# Patient Record
Sex: Female | Born: 1985 | Race: Black or African American | Hispanic: No | Marital: Single | Smoking: Never smoker
Health system: Southern US, Community
[De-identification: ages and names within clinical notes are randomized; demographics above are authoritative.]

## PROBLEM LIST (undated history)

## (undated) DIAGNOSIS — N200 Calculus of kidney: Secondary | ICD-10-CM

## (undated) DIAGNOSIS — Z87442 Personal history of urinary calculi: Secondary | ICD-10-CM

---

## 2011-12-03 ENCOUNTER — Emergency Department (HOSPITAL_COMMUNITY)
Admission: EM | Admit: 2011-12-03 | Discharge: 2011-12-04 | Disposition: A | Discharge status: Home / Self Care | Payer: Self-pay | Attending: Emergency Medicine | Admitting: Emergency Medicine

## 2011-12-03 DIAGNOSIS — N390 Urinary tract infection, site not specified: Secondary | ICD-10-CM | POA: Insufficient documentation

## 2011-12-03 DIAGNOSIS — R1032 Left lower quadrant pain: Secondary | ICD-10-CM | POA: Insufficient documentation

## 2011-12-03 DIAGNOSIS — Z87440 Personal history of urinary (tract) infections: Secondary | ICD-10-CM | POA: Insufficient documentation

## 2011-12-03 DIAGNOSIS — R300 Dysuria: Secondary | ICD-10-CM | POA: Insufficient documentation

## 2011-12-03 DIAGNOSIS — R112 Nausea with vomiting, unspecified: Secondary | ICD-10-CM | POA: Insufficient documentation

## 2011-12-04 ENCOUNTER — Encounter: Payer: Self-pay | Admitting: Emergency Medicine

## 2011-12-04 LAB — URINALYSIS, ROUTINE W REFLEX MICROSCOPIC
Bilirubin Urine: NEGATIVE
Glucose, UA: NEGATIVE mg/dL
Ketones, ur: NEGATIVE mg/dL
Specific Gravity, Urine: 1.026 (ref 1.005–1.030)
pH: 6 (ref 5.0–8.0)

## 2011-12-04 LAB — URINE MICROSCOPIC-ADD ON

## 2011-12-04 MED ORDER — NAPROXEN 500 MG PO TABS: 500.0000 mg | ORAL_TABLET | Freq: Two times a day (BID) | ORAL | Status: AC

## 2011-12-04 MED ORDER — PHENAZOPYRIDINE HCL 200 MG PO TABS: 200.0000 mg | ORAL_TABLET | Freq: Three times a day (TID) | ORAL | Status: AC

## 2011-12-04 MED ORDER — IBUPROFEN 800 MG PO TABS
800.0000 mg | ORAL_TABLET | Freq: Once | ORAL | Status: AC
  Administered 2011-12-04: 800 mg via ORAL
  Filled 2011-12-04: qty 1

## 2011-12-04 MED ORDER — NITROFURANTOIN MONOHYD MACRO 100 MG PO CAPS: 100.0000 mg | ORAL_CAPSULE | Freq: Two times a day (BID) | ORAL | Status: AC

## 2011-12-04 NOTE — ED Notes (Signed)
Pt to ED c/o LLQ pain.  Pt states she is mid-cycle.  Denies urinary symptoms or fevers.  States she is sexually active with 1 female partner of 8 years.

## 2011-12-04 NOTE — ED Notes (Signed)
Pt c/o LLQ pain onset 2 days ago with nausea .  Pt has hx of UTI's

## 2011-12-04 NOTE — ED Provider Notes (Signed)
History     CSN: [id number] Arrival date & time: 12/03/2011 11:32 PM   First MD Initiated Contact with Patient 12/04/11 0121      Chief Complaint  Patient presents with  . Abdominal Pain    (Consider location/radiation/quality/duration/timing/severity/associated sxs/prior treatment) HPI Comments: History of 2 urinary infections in the past, presents 2 days after developing pressure with urination, sharp and stabbing pains in the left lower quadrant which are intermittent and worse with urination. She denies fevers or back pain but does have some nausea and one episode of vomiting which occurred yesterday.  Patient is a 25 y.o. female presenting with abdominal pain. The history is provided by the patient.  Abdominal Pain The primary symptoms of the illness include abdominal pain, nausea, vomiting ( 1 episode yesterday ) and dysuria. The primary symptoms of the illness do not include fever, fatigue, shortness of breath, diarrhea, hematemesis, vaginal discharge or vaginal bleeding. The current episode started 2 days ago. The onset of the illness was gradual. The problem has been gradually worsening.  The dysuria began 2 days ago. The discomfort is felt in the suprapubic area. The discomfort is moderate. The dysuria is not associated with discharge, hematuria, frequency or vaginal pain.  Symptoms associated with the illness do not include hematuria, frequency or back pain. Associated symptoms comments: urgency.    Past Medical History  Diagnosis Date  . Urinary tract infection     History reviewed. No pertinent past surgical history.  No family history on file.  History  Substance Use Topics  . Smoking status: Never Smoker   . Smokeless tobacco: Not on file  . Alcohol Use: Yes     rare    OB History    Grav Para Term Preterm Abortions TAB SAB Ect Mult Living                  Review of Systems  Constitutional: Negative for fever and fatigue.  Respiratory: Negative for  shortness of breath.   Gastrointestinal: Positive for nausea, vomiting ( 1 episode yesterday ) and abdominal pain. Negative for diarrhea and hematemesis.  Genitourinary: Positive for dysuria. Negative for frequency, hematuria, vaginal bleeding, vaginal discharge and vaginal pain.  Musculoskeletal: Negative for back pain.  All other systems reviewed and are negative.    Allergies  Review of patient's allergies indicates no known allergies.  Home Medications   Current Outpatient Rx  Name Route Sig Dispense Refill  . ACETAMINOPHEN 500 MG PO TABS Oral Take 1,500 mg by mouth every 6 (six) hours as needed. For pain     . OVER THE COUNTER MEDICATION  Medication for bladder infection symptoms     . NAPROXEN 500 MG PO TABS Oral Take 1 tablet (500 mg total) by mouth 2 (two) times daily with a meal. 30 tablet 0  . NITROFURANTOIN MONOHYD MACRO 100 MG PO CAPS Oral Take 1 capsule (100 mg total) by mouth 2 (two) times daily. 10 capsule 0  . PHENAZOPYRIDINE HCL 200 MG PO TABS Oral Take 1 tablet (200 mg total) by mouth 3 (three) times daily. 6 tablet 0    BP 152/101  Pulse 104  Temp(Src) 98 F (36.7 C) (Oral)  Resp 16  SpO2 96%  LMP 11/23/2011  Physical Exam  Nursing note and vitals reviewed. Constitutional: She appears well-developed and well-nourished. No distress.  HENT:  Head: Normocephalic and atraumatic.  Mouth/Throat: Oropharynx is clear and moist. No oropharyngeal exudate.  Eyes: Conjunctivae and EOM are normal. Pupils are  equal, round, and reactive to light. Right eye exhibits no discharge. Left eye exhibits no discharge. No scleral icterus.  Neck: Normal range of motion. Neck supple. No JVD present. No thyromegaly present.  Cardiovascular: Normal rate, regular rhythm, normal heart sounds and intact distal pulses.  Exam reveals no gallop and no friction rub.   No murmur heard. Pulmonary/Chest: Effort normal and breath sounds normal. No respiratory distress. She has no wheezes. She has  no rales.  Abdominal: Soft. Bowel sounds are normal. She exhibits no distension and no mass. There is tenderness ( mild left lower cautery tenderness, no guarding or masses. No pain at McBurney's point or right upper quadrant. Non-peritoneal).       No CVA tenderness  Musculoskeletal: Normal range of motion. She exhibits no edema and no tenderness.  Lymphadenopathy:    She has no cervical adenopathy.  Neurological: She is alert. Coordination normal.  Skin: Skin is warm and dry. No rash noted. No erythema.  Psychiatric: She has a normal mood and affect. Her behavior is normal.    ED Course  Procedures (including critical care time)  Labs Reviewed  URINALYSIS, ROUTINE W REFLEX MICROSCOPIC - Abnormal; Notable for the following:    Color, Urine ORANGE (*) BIOCHEMICALS MAY BE AFFECTED BY COLOR   Hgb urine dipstick SMALL (*)    Nitrite POSITIVE (*)    Leukocytes, UA SMALL (*)    All other components within normal limits  URINE MICROSCOPIC-ADD ON - Abnormal; Notable for the following:    Squamous Epithelial / LPF FEW (*)    All other components within normal limits  POCT PREGNANCY, URINE  POCT PREGNANCY, URINE   No results found.   1. Urinary tract infection   2. Abdominal pain, LLQ       MDM  Well appearing, vital signs without fever. Urinalysis sent for evaluation. Ibuprofenrequested for pain  Urinalysis shows possible mild UTI. Left lower quadrant pain not consistent with findings. I have recommended further testing for the patient including an ultrasound which she has declined tonight. She agrees to followup as outpatient for further testing. agrees to return to the emergency department for severe or worsening pain or vomiting  Discharge prescriptions  #1 Macrobid #2 Naprosyn #3 Pyridium      Vida Roller, MD 12/04/11 [phone]

## 2011-12-06 ENCOUNTER — Encounter (HOSPITAL_COMMUNITY): Payer: Self-pay | Admitting: Emergency Medicine

## 2011-12-06 ENCOUNTER — Emergency Department (HOSPITAL_COMMUNITY): Payer: Self-pay

## 2011-12-06 ENCOUNTER — Emergency Department (HOSPITAL_COMMUNITY)
Admission: EM | Admit: 2011-12-06 | Discharge: 2011-12-07 | Disposition: A | Discharge status: Home / Self Care | Payer: Self-pay | Attending: Emergency Medicine | Admitting: Emergency Medicine

## 2011-12-06 DIAGNOSIS — R1032 Left lower quadrant pain: Secondary | ICD-10-CM | POA: Insufficient documentation

## 2011-12-06 DIAGNOSIS — R109 Unspecified abdominal pain: Secondary | ICD-10-CM | POA: Insufficient documentation

## 2011-12-06 DIAGNOSIS — N200 Calculus of kidney: Secondary | ICD-10-CM | POA: Insufficient documentation

## 2011-12-06 DIAGNOSIS — N1330 Unspecified hydronephrosis: Secondary | ICD-10-CM | POA: Insufficient documentation

## 2011-12-06 DIAGNOSIS — R112 Nausea with vomiting, unspecified: Secondary | ICD-10-CM | POA: Insufficient documentation

## 2011-12-06 LAB — COMPREHENSIVE METABOLIC PANEL
ALT: 10 U/L (ref 0–35)
Albumin: 3.9 g/dL (ref 3.5–5.2)
Alkaline Phosphatase: 75 U/L (ref 39–117)
BUN: 9 mg/dL (ref 6–23)
Calcium: 9.8 mg/dL (ref 8.4–10.5)
GFR calc Af Amer: >90 mL/min (ref >90)
Glucose, Bld: 102 mg/dL — ABNORMAL HIGH (ref 70–99)
Potassium: 4.2 mEq/L (ref 3.5–5.1)
Sodium: 135 mEq/L (ref 135–145)
Total Protein: 7.6 g/dL (ref 6.0–8.3)

## 2011-12-06 LAB — CBC
Hemoglobin: 11.4 g/dL — ABNORMAL LOW (ref 12.0–15.0)
MCH: 26 pg (ref 26.0–34.0)
MCHC: 31.7 g/dL (ref 30.0–36.0)
RDW: 15.9 % — ABNORMAL HIGH (ref 11.5–15.5)

## 2011-12-06 LAB — URINALYSIS, ROUTINE W REFLEX MICROSCOPIC
Ketones, ur: 15 mg/dL — AB
Protein, ur: NEGATIVE mg/dL
Urobilinogen, UA: 1 mg/dL (ref 0.0–1.0)

## 2011-12-06 LAB — POCT PREGNANCY, URINE: Preg Test, Ur: NEGATIVE

## 2011-12-06 LAB — URINE MICROSCOPIC-ADD ON

## 2011-12-06 MED ORDER — MORPHINE SULFATE 4 MG/ML IJ SOLN
4.0000 mg | Freq: Once | INTRAMUSCULAR | Status: AC
  Administered 2011-12-06: 4 mg via INTRAVENOUS
  Filled 2011-12-06: qty 1

## 2011-12-06 MED ORDER — SODIUM CHLORIDE 0.9 % IV BOLUS (SEPSIS)
1000.0000 mL | Freq: Once | INTRAVENOUS | Status: AC
  Administered 2011-12-06: 1000 mL via INTRAVENOUS

## 2011-12-06 MED ORDER — KETOROLAC TROMETHAMINE 30 MG/ML IJ SOLN
30.0000 mg | Freq: Once | INTRAMUSCULAR | Status: AC
  Administered 2011-12-06: 30 mg via INTRAVENOUS
  Filled 2011-12-06: qty 1

## 2011-12-06 MED ORDER — TAMSULOSIN HCL 0.4 MG PO CAPS
0.4000 mg | ORAL_CAPSULE | ORAL | Status: AC
  Administered 2011-12-06: 0.4 mg via ORAL
  Filled 2011-12-06: qty 1

## 2011-12-06 MED ORDER — ONDANSETRON HCL 4 MG PO TABS: 4.0000 mg | ORAL_TABLET | Freq: Four times a day (QID) | ORAL | Status: AC

## 2011-12-06 MED ORDER — OXYCODONE-ACETAMINOPHEN 5-325 MG PO TABS: 1.0000 | ORAL_TABLET | ORAL | Status: AC | PRN

## 2011-12-06 NOTE — ED Notes (Signed)
Pt states she walked to the restroom and is having severe lower abd pain, will notify the provider

## 2011-12-06 NOTE — ED Notes (Signed)
Pt c/o lower abd pain x several days with N/V x 2 days; pt sts was seen here for UTI sx on Sunday and now feeling worse

## 2011-12-06 NOTE — ED Notes (Signed)
Called pharmacy unable to find dose of flomax. States dose was sent earlier, requested a note be sent to pharmacy requesting additional dose.

## 2011-12-06 NOTE — ED Notes (Signed)
Pt returned from CT, IVF infusing without difficulty

## 2011-12-06 NOTE — ED Notes (Signed)
Pt off of floor to CT.

## 2011-12-06 NOTE — ED Provider Notes (Signed)
History     CSN: [id number] Arrival date & time: 12/06/2011  4:31 PM   First MD Initiated Contact with Patient 12/06/11 2010      Chief Complaint  Patient presents with  . Abdominal Pain  . Emesis    (Consider location/radiation/quality/duration/timing/severity/associated sxs/prior treatment) Patient is a 25 y.o. female presenting with abdominal pain. The history is provided by the patient and medical records.  Abdominal Pain The primary symptoms of the illness include abdominal pain, nausea and vomiting. The primary symptoms of the illness do not include fever, shortness of breath, diarrhea or dysuria. The current episode started more than 2 days ago (about 5 days ago). The onset of the illness was gradual. The problem has been gradually worsening.  The abdominal pain is located in the LLQ. The abdominal pain radiates to the left flank. The severity of the abdominal pain is 2/10 (at worst, is 10/10). The abdominal pain is relieved by nothing. The abdominal pain is exacerbated by urination (often spontaneous).  Vomiting occurred once. The emesis contains stomach contents.  The patient states that she believes she is currently not pregnant. The patient has not had a change in bowel habit. Additional symptoms associated with the illness include urgency and frequency. Symptoms associated with the illness do not include chills, constipation, hematuria or back pain.    Past Medical History  Diagnosis Date  . Urinary tract infection     History reviewed. No pertinent past surgical history.  History reviewed. No pertinent family history.  History  Substance Use Topics  . Smoking status: Never Smoker   . Smokeless tobacco: Not on file  . Alcohol Use: Yes     rare    OB History    Grav Para Term Preterm Abortions TAB SAB Ect Mult Living                  Review of Systems  Constitutional: Negative for fever and chills.  Respiratory: Negative for cough and shortness of breath.     Cardiovascular: Negative for chest pain and palpitations.  Gastrointestinal: Positive for nausea, vomiting and abdominal pain. Negative for diarrhea, constipation, blood in stool and abdominal distention.  Genitourinary: Positive for urgency and frequency. Negative for dysuria and hematuria.  Musculoskeletal: Negative for back pain.  Skin: Negative for color change and rash.  Neurological: Negative for light-headedness and headaches.  All other systems reviewed and are negative.    Allergies  Review of patient's allergies indicates no known allergies.  Home Medications   Current Outpatient Rx  Name Route Sig Dispense Refill  . ACETAMINOPHEN 500 MG PO TABS Oral Take 1,500 mg by mouth every 6 (six) hours as needed. For pain     . NAPROXEN 500 MG PO TABS Oral Take 1 tablet (500 mg total) by mouth 2 (two) times daily with a meal. 30 tablet 0  . NITROFURANTOIN MONOHYD MACRO 100 MG PO CAPS Oral Take 1 capsule (100 mg total) by mouth 2 (two) times daily. 10 capsule 0  . PHENAZOPYRIDINE HCL 200 MG PO TABS Oral Take 1 tablet (200 mg total) by mouth 3 (three) times daily. 6 tablet 0    BP 124/83  Pulse 102  Temp(Src) 98.2 F (36.8 C) (Oral)  Resp 16  SpO2 99%  LMP 11/23/2011  Physical Exam  Nursing note and vitals reviewed. Constitutional: She is oriented to person, place, and time. She appears well-developed and well-nourished.  HENT:  Head: Normocephalic and atraumatic.  Eyes: Pupils are equal, round,  and reactive to light.  Cardiovascular: Normal rate, regular rhythm, normal heart sounds and intact distal pulses.   Pulmonary/Chest: Effort normal and breath sounds normal. No respiratory distress.  Abdominal: Soft. Bowel sounds are normal. She exhibits no distension. There is tenderness (mild, LLQ). There is no rebound and no guarding.       No CVA tenderness  Neurological: She is alert and oriented to person, place, and time.  Skin: Skin is warm and dry.  Psychiatric: She has a  normal mood and affect.    ED Course  Procedures (including critical care time)  Labs Reviewed  URINALYSIS, ROUTINE W REFLEX MICROSCOPIC - Abnormal; Notable for the following:    Color, Urine ORANGE (*) BIOCHEMICALS MAY BE AFFECTED BY COLOR   APPearance CLOUDY (*)    Hgb urine dipstick MODERATE (*)    Bilirubin Urine SMALL (*)    Ketones, ur 15 (*)    Nitrite POSITIVE (*)    Leukocytes, UA SMALL (*)    All other components within normal limits  CBC - Abnormal; Notable for the following:    WBC 11.4 (*)    Hemoglobin 11.4 (*)    RDW 15.9 (*)    All other components within normal limits  COMPREHENSIVE METABOLIC PANEL - Abnormal; Notable for the following:    Glucose, Bld 102 (*)    Total Bilirubin 0.2 (*)    All other components within normal limits  URINE MICROSCOPIC-ADD ON - Abnormal; Notable for the following:    Squamous Epithelial / LPF MANY (*)    Bacteria, UA FEW (*)    Crystals CA OXALATE CRYSTALS (*)    All other components within normal limits  POCT PREGNANCY, URINE  POCT PREGNANCY, URINE  URINE CULTURE   Ct Abdomen Pelvis Wo Contrast  12/06/2011  *RADIOLOGY REPORT*  Clinical Data: Left lower quadrant pain.  CT ABDOMEN AND PELVIS WITHOUT CONTRAST  Technique:  Multidetector CT imaging of the abdomen and pelvis was performed following the standard protocol without intravenous contrast.  Comparison: None.  Findings: Limited images through the lung bases demonstrate no significant appreciable abnormality. The heart size is within normal limits. No pleural or pericardial effusion.  Intra-abdominal organ evaluation is limited without intravenous contrast.  Within this limitation, low attenuation adjacent to the falciform ligament is favored to represent focal fat.  Unremarkable biliary system, spleen, pancreas, adrenal glands.  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  There are bilateral nonobstructing renal stones.   There is mild left kidney hydronephrosis and hydroureter to the level of an 8 mm stone near the UVJ.  No bowel obstruction.  No CT evidence for colitis.  Normal appendix.  No free intraperitoneal air or fluid.  No lymphadenopathy.  Normal caliber vasculature.  Small fat containing umbilical hernia.  Uterus and adnexa within normal limits for patient's age and noncontrast technique.  No acute osseous abnormality.  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  There is mild left-sided hydroureteronephrosis to the level of an 8 mm UVJ stone.  There are bilateral nonobstructing renal stones.  Original Report Authenticated By: Waneta Martins, M.D.     1. Nephrolithiasis       MDM  This is a 24 year old female here for left lower abdominal pain. She states the pain started several days ago, and she was seen here about 2-1/2 days ago for the same. At that time she was diagnosed  with a UTI, and decided not to wait for several more hours to get a pelvic ultrasound to evaluate for ovarian cyst, and was going to followup with that as an outpatient. She initially felt well after leaving, but has been feeling worse again today. She states the pain is in the left lower outer and, and at baseline is a mild discomfort, but she has intermittent episodes of sharp shooting pain, radiating up the left side, which often happen spontaneously, but are occasionally worsened by urination. She denies dysuria, but has a "pressure" sensation when urinating, as well as frequency and urgency. She has had intermittent nausea, with several episodes of vomiting. There is no right-sided or upper abdominal pain, she has had no changes in her bowel movements, no fevers, no other complaints. Exam is remarkable for a very mild discomfort with palpation of the left lower quadrant, there is no other abdominal tenderness, no CVA tenderness no rebound or guarding. Review of the  records from her last visit show a urinalysis that had positive nitrites, small leukocytes and small blood. She was started on nitrofurantoin for her urinalysis, but a culture was not sent at that time. Will recheck her urine today, as well as basic labs. Due to patient's complaint of colicky type pain, radiating up her side, and it's occasional association with urination, in addition to blood on her UA, have higher suspicion for possible kidney stone. We will get a noncontrast CT scan to evaluate for this.   Patient with continued signs of infection on her UA, as well as increasing blood, and oxalate stones. She is noted on CT scan to have a nearly 8 mm UPJ stone with mild hydroureteronephrosis. Discussed this patient with the on call urologist, Dr. Patsi Sears, who recommends pain control, and he was here in the clinic tomorrow. Discussed this with the patient, who expressed understanding and is in agreement with the plan.     Theotis Burrow, MD 12/07/11 [phone]

## 2011-12-07 LAB — URINE CULTURE: Culture: NO GROWTH

## 2011-12-07 NOTE — ED Provider Notes (Signed)
I saw and evaluated the patient, reviewed the resident's note and I agree with the findings and plan. Left lower abdominal pain. Recently seen for the same. Diagnoses UTI at that time, the patient refused further workup. She returns now it has been found to have a kidney stone. Still possible urinary tract infection. White count minimally elevated. Pain isn't controlled the ER. After discussion with nephrology she'll be seen in the office tomorrow.  Juliet Rude. Rubin Payor, MD 12/07/11 [phone]

## 2015-05-30 ENCOUNTER — Encounter (HOSPITAL_COMMUNITY): Payer: Self-pay | Admitting: *Deleted

## 2015-05-30 ENCOUNTER — Emergency Department (HOSPITAL_COMMUNITY): Payer: Self-pay

## 2015-05-30 ENCOUNTER — Emergency Department (HOSPITAL_COMMUNITY)
Admission: EM | Admit: 2015-05-30 | Discharge: 2015-05-30 | Disposition: A | Discharge status: Home / Self Care | Payer: Self-pay | Attending: Emergency Medicine | Admitting: Emergency Medicine

## 2015-05-30 DIAGNOSIS — R112 Nausea with vomiting, unspecified: Secondary | ICD-10-CM | POA: Insufficient documentation

## 2015-05-30 DIAGNOSIS — Z3202 Encounter for pregnancy test, result negative: Secondary | ICD-10-CM | POA: Insufficient documentation

## 2015-05-30 DIAGNOSIS — Q638 Other specified congenital malformations of kidney: Secondary | ICD-10-CM | POA: Insufficient documentation

## 2015-05-30 DIAGNOSIS — K5900 Constipation, unspecified: Secondary | ICD-10-CM | POA: Insufficient documentation

## 2015-05-30 DIAGNOSIS — R109 Unspecified abdominal pain: Secondary | ICD-10-CM

## 2015-05-30 DIAGNOSIS — D649 Anemia, unspecified: Secondary | ICD-10-CM | POA: Insufficient documentation

## 2015-05-30 DIAGNOSIS — Q639 Congenital malformation of kidney, unspecified: Secondary | ICD-10-CM

## 2015-05-30 DIAGNOSIS — N200 Calculus of kidney: Secondary | ICD-10-CM | POA: Insufficient documentation

## 2015-05-30 DIAGNOSIS — Z87440 Personal history of urinary (tract) infections: Secondary | ICD-10-CM | POA: Insufficient documentation

## 2015-05-30 LAB — COMPREHENSIVE METABOLIC PANEL
ALBUMIN: 3.4 g/dL — AB (ref 3.5–5.0)
ALK PHOS: 62 U/L (ref 38–126)
ALT: 13 U/L — AB (ref 14–54)
Anion gap: 8 (ref 5–15)
BUN: 6 mg/dL (ref 6–20)
CALCIUM: 9.6 mg/dL (ref 8.9–10.3)
CO2: 26 mmol/L (ref 22–32)
CREATININE: 0.82 mg/dL (ref 0.44–1.00)
Chloride: 101 mmol/L (ref 101–111)
GFR calc Af Amer: >60 mL/min (ref >60)
GLUCOSE: 131 mg/dL — AB (ref 65–99)
Potassium: 3.7 mmol/L (ref 3.5–5.1)
Sodium: 135 mmol/L (ref 135–145)
TOTAL PROTEIN: 6.8 g/dL (ref 6.5–8.1)
Total Bilirubin: 0.5 mg/dL (ref 0.3–1.2)

## 2015-05-30 LAB — URINALYSIS, ROUTINE W REFLEX MICROSCOPIC
BILIRUBIN URINE: NEGATIVE
Glucose, UA: NEGATIVE mg/dL
Ketones, ur: NEGATIVE mg/dL
NITRITE: NEGATIVE
Protein, ur: NEGATIVE mg/dL
Specific Gravity, Urine: 1.008 (ref 1.005–1.030)
UROBILINOGEN UA: 0.2 mg/dL (ref 0.0–1.0)
pH: 6.5 (ref 5.0–8.0)

## 2015-05-30 LAB — URINE MICROSCOPIC-ADD ON

## 2015-05-30 LAB — CBC WITH DIFFERENTIAL/PLATELET
BASOS ABS: 0 10*3/uL (ref 0.0–0.1)
Basophils Relative: 0 % (ref 0–1)
Eosinophils Absolute: 0 10*3/uL (ref 0.0–0.7)
Eosinophils Relative: 0 % (ref 0–5)
HEMATOCRIT: 34.6 % — AB (ref 36.0–46.0)
Hemoglobin: 10.8 g/dL — ABNORMAL LOW (ref 12.0–15.0)
LYMPHS ABS: 1.4 10*3/uL (ref 0.7–4.0)
LYMPHS PCT: 14 % (ref 12–46)
MCH: 24 pg — ABNORMAL LOW (ref 26.0–34.0)
MCHC: 31.2 g/dL (ref 30.0–36.0)
MCV: 76.9 fL — ABNORMAL LOW (ref 78.0–100.0)
MONO ABS: 0.6 10*3/uL (ref 0.1–1.0)
Monocytes Relative: 5 % (ref 3–12)
NEUTROS ABS: 8.1 10*3/uL — AB (ref 1.7–7.7)
NEUTROS PCT: 81 % — AB (ref 43–77)
Platelets: 330 10*3/uL (ref 150–400)
RBC: 4.5 MIL/uL (ref 3.87–5.11)
RDW: 16.8 % — AB (ref 11.5–15.5)
WBC: 10.1 10*3/uL (ref 4.0–10.5)

## 2015-05-30 LAB — POC URINE PREG, ED: Preg Test, Ur: NEGATIVE

## 2015-05-30 MED ORDER — OXYCODONE-ACETAMINOPHEN 5-325 MG PO TABS: 1.0000 | ORAL_TABLET | Freq: Four times a day (QID) | ORAL | Status: DC | PRN

## 2015-05-30 MED ORDER — NAPROXEN 500 MG PO TABS: 500.0000 mg | ORAL_TABLET | Freq: Two times a day (BID) | ORAL | Status: DC | PRN

## 2015-05-30 MED ORDER — ONDANSETRON HCL 8 MG PO TABS: 8.0000 mg | ORAL_TABLET | Freq: Three times a day (TID) | ORAL | Status: DC | PRN

## 2015-05-30 MED ORDER — HYDROMORPHONE HCL 1 MG/ML IJ SOLN
0.5000 mg | Freq: Once | INTRAMUSCULAR | Status: AC
  Administered 2015-05-30: 0.5 mg via INTRAVENOUS
  Filled 2015-05-30: qty 1

## 2015-05-30 MED ORDER — KETOROLAC TROMETHAMINE 30 MG/ML IJ SOLN
30.0000 mg | Freq: Once | INTRAMUSCULAR | Status: AC
  Administered 2015-05-30: 30 mg via INTRAVENOUS
  Filled 2015-05-30: qty 1

## 2015-05-30 NOTE — ED Notes (Signed)
The pt is c/o lt flank pain suince Tuesday she has been seen at urgent care and at [facility] for the same .  The pain ius getting wiorse.  lmp  Yesterday  n v

## 2015-05-30 NOTE — ED Provider Notes (Signed)
CSN: [id number]     Arrival date & time 05/30/15  7829 History   First MD Initiated Contact with Patient 05/30/15 [phone]     Chief Complaint  Patient presents with  . Flank Pain     (Consider location/radiation/quality/duration/timing/severity/associated sxs/prior Treatment) HPI Comments: Veronica Cunningham is a 29 y.o. female with a PMHx of UTIs and nephrolithiasis, who presents to the ED with complaints of left flank pain that initially began 5 days ago. She went to urgent care where they started her on anti-biotics for a suspected UTI. The next day she went to no vomit in [place] because she believes she had a kidney stone, and had a CT scan done there that showed a kidney stone. She has had kidney stones in the past, and 2012 she had an 8 mm stone. She reports that she has been taking Percocet since Wednesday with relief of her symptoms, but at 2 AM the pain increased becoming a 7/10 intermittent waxing and waning sharp pain in the left flank radiating to the periumbilical area, worse with movement, and mildly improved with Percocet. She endorses associated nausea with 4 episodes of nonbloody nonbilious emesis this morning and constipation with her last bowel movement being on Thursday. She states the constipation only began after she started taking Percocet. She denies any fevers, chills, chest pain, shortness of breath, diarrhea, melanoma, hematochezia, obstipation, dysuria, hematuria, vaginal bleeding or discharge, numbness, tingling, weakness, recent travel, sick contacts, suspicious food intake, alcohol use, or NSAIDs. She is sexually active with one female partner, protected with condoms. LMP 6/6.  Patient is a 29 y.o. female presenting with flank pain. The history is provided by the patient. No language interpreter was used.  Flank Pain This is a recurrent problem. The current episode started today. The problem occurs intermittently. The problem has been gradually worsening. Associated  symptoms include abdominal pain (radiating from flank), nausea and vomiting. Pertinent negatives include no arthralgias, chest pain, chills, fever, myalgias, numbness, urinary symptoms or weakness. Exacerbated by: movement. She has tried oral narcotics for the symptoms. The treatment provided mild relief.    Past Medical History  Diagnosis Date  . Urinary tract infection    History reviewed. No pertinent past surgical history. No family history on file. History  Substance Use Topics  . Smoking status: Never Smoker   . Smokeless tobacco: Not on file  . Alcohol Use: Yes     Comment: rare   OB History    No data available     Review of Systems  Constitutional: Negative for fever and chills.  Respiratory: Negative for shortness of breath.   Cardiovascular: Negative for chest pain.  Gastrointestinal: Positive for nausea, vomiting, abdominal pain (radiating from flank) and constipation (since starting percocet). Negative for diarrhea and blood in stool.       No obstipation  Genitourinary: Positive for flank pain. Negative for dysuria, hematuria, vaginal bleeding and vaginal discharge.  Musculoskeletal: Negative for myalgias and arthralgias.  Skin: Negative for color change.  Allergic/Immunologic: Negative for immunocompromised state.  Neurological: Negative for weakness and numbness.  Psychiatric/Behavioral: Negative for confusion.   10 Systems reviewed and are negative for acute change except as noted in the HPI.    Allergies  Review of patient's allergies indicates no known allergies.  Home Medications   Prior to Admission medications   Medication Sig Start Date End Date Taking? Authorizing Provider  acetaminophen (TYLENOL) 500 MG tablet Take 1,500 mg by mouth every 6 (six) hours as needed. For  pain     Historical Provider, MD   BP 125/80 mmHg  Pulse 83  Temp(Src) 98.4 F (36.9 C) (Oral)  Resp 18  Ht  (1.575 m)  Wt 213 lb (96.616 kg)  BMI 38.95 kg/m2  SpO2 96%   LMP 05/29/2015 Physical Exam  Constitutional: She is oriented to person, place, and time. Vital signs are normal. She appears well-developed and well-nourished.  Non-toxic appearance. No distress.  Afebrile, nontoxic, NAD  HENT:  Head: Normocephalic and atraumatic.  Mouth/Throat: Oropharynx is clear and moist and mucous membranes are normal.  Eyes: Conjunctivae and EOM are normal. Right eye exhibits no discharge. Left eye exhibits no discharge.  Neck: Normal range of motion. Neck supple.  Cardiovascular: Normal rate, regular rhythm, normal heart sounds and intact distal pulses.  Exam reveals no gallop and no friction rub.   No murmur heard. Pulmonary/Chest: Effort normal and breath sounds normal. No respiratory distress. She has no decreased breath sounds. She has no wheezes. She has no rhonchi. She has no rales.  Abdominal: Soft. Normal appearance and bowel sounds are normal. She exhibits no distension. There is tenderness in the right lower quadrant and left lower quadrant. There is no rigidity, no rebound, no guarding, no CVA tenderness, no tenderness at McBurney's point and negative Murphy's sign.    Soft, nondistended, +BS throughout, with L lateral abdomen TTP extending down towards the LLQ as well as R lateral abdomen TTP extending towards the RLQ, no r/g/r, neg murphy's, neg mcburney's, no CVA TTP   Musculoskeletal: Normal range of motion.  Neurological: She is alert and oriented to person, place, and time. She has normal strength. No sensory deficit.  Skin: Skin is warm, dry and intact. No rash noted.  Psychiatric: She has a normal mood and affect.  Nursing note and vitals reviewed.   ED Course  Procedures (including critical care time) Labs Review Labs Reviewed  CBC WITH DIFFERENTIAL/PLATELET - Abnormal; Notable for the following:    Hemoglobin 10.8 (*)    HCT 34.6 (*)    MCV 76.9 (*)    MCH 24.0 (*)    RDW 16.8 (*)    Neutrophils Relative % 81 (*)    Neutro Abs 8.1 (*)      All other components within normal limits  COMPREHENSIVE METABOLIC PANEL - Abnormal; Notable for the following:    Glucose, Bld 131 (*)    Albumin 3.4 (*)    AST <5 (*)    ALT 13 (*)    All other components within normal limits  URINALYSIS, ROUTINE W REFLEX MICROSCOPIC (NOT AT [facility]) - Abnormal; Notable for the following:    Hgb urine dipstick MODERATE (*)    Leukocytes, UA SMALL (*)    All other components within normal limits  URINE MICROSCOPIC-ADD ON - Abnormal; Notable for the following:    Squamous Epithelial / LPF FEW (*)    Bacteria, UA FEW (*)    All other components within normal limits  POC URINE PREG, ED    Imaging Review Ct Abdomen Pelvis Wo Contrast  05/30/2015   CLINICAL DATA:  Left flank pain since Tuesday. History urinary tract infection and kidney stones.  EXAM: CT ABDOMEN AND PELVIS WITHOUT CONTRAST  TECHNIQUE: Multidetector CT imaging of the abdomen and pelvis was performed following the standard protocol without IV contrast.  COMPARISON:  12/06/11  FINDINGS: Lower chest: Minimal bibasilar atelectasis. Heart size upper normal, without pericardial or pleural effusion.  Hepatobiliary: Focal steatosis adjacent the falciform ligament.  Normal gallbladder, without biliary ductal dilatation.  Pancreas: Normal, without mass or ductal dilatation.  Spleen: Normal  Adrenals/Urinary Tract: Normal adrenal glands. Mild right-sided hydroureteronephrosis secondary to a 7 mm proximal right ureteric stone. The left kidney is again ectopic, positioned in the lower right abdomen. Bilateral nephrolithiasis. No distal hydroureter. No bladder calculi.  Stomach/Bowel: Normal stomach, without wall thickening. Normal colon, appendix, and terminal ileum. Normal small bowel.  Vascular/Lymphatic: Normal caliber of the aorta and branch vessels. No abdominopelvic adenopathy.  Reproductive: Normal uterus and adnexa.  Other: No significant free fluid.  Musculoskeletal: No acute osseous abnormality.   IMPRESSION: 1. Mild right-sided hydroureteronephrosis secondary to a 7 mm proximal right ureteric calculus. 2. Ectopic left kidney, positioned within the low right abdomen. 3. Bilateral nephrolithiasis.   Electronically Signed   By: Jeronimo Greaves M.D.   On: 05/30/2015 08:20   CT from [facility] on 05/25/14: 6mm proximal R ureteral calculus within native R kidney, ectopic L kidney in R hemiabdomen anterior to native R kidney   EKG Interpretation None      MDM   Final diagnoses:  Nephrolithiasis  Congenital anomaly of kidney  Non-intractable vomiting with nausea, vomiting of unspecified type  Constipation, unspecified constipation type  Left flank pain    29 y.o. female here with L flank pain x5 days, went to [place] where they diagnosed her with a kidney stone, and pain had improved with pain meds but at 2am it returned. On exam, she has mild L and R lateral abdominal pain, no CVA TTP. No pelvic complaints, and no lower pelvic tenderness, doubt need for pelvic. Some constipation, but still passing flatus, and +BS throughout, doubt obstruction. Doubt pyelonephritis. Will attempt to get records from [facility] to evaluate how large the stone is and whether it's likely to pass. Will try to avoid obtaining more CT scans. Upreg neg, U/A with few squamous, small leuks, 3-6 WBC, and few bacteria, likely contaminated catch. Moderate Hgb on U/A. CBC showing chronic anemia. CMP unremarkable. Will give pain meds and reassess. Pt declines nausea meds, states she's not nauseated now.  9:00 AM Unfortunately CT was ordered by another provider prior to being able to obtain CT from [facility]. [facility] CT showing 6mm stone in R ureter with L kidney ectopic in R abdomen. CT today showing 7mm proximal R ureter stone with mild hydronephrosis, again the ectopic L kidney in the R abdomen. Consulted urology, Dr. Mena Goes wants pt to get toradol now and call his office tomorrow to set up intervention for the stone. Pt with relief of  pain at this time, but per Dr. Estil Daft orders will proceed with toradol prior to d/c.  9:51 AM Pain continues to be controlled. Nausea controlled, tolerating PO well. Will send home with zofran, pain meds, and urine strainer. Advised pt to continue using home flomax. Will have her f/up with Dr. Mena Goes this week for intervention of the stone. Advised miralax use and good hydration. I explained the diagnosis and have given explicit precautions to return to the ER including for any other new or worsening symptoms. The patient understands and accepts the medical plan as it's been dictated and I have answered their questions. Discharge instructions concerning home care and prescriptions have been given. The patient is STABLE and is discharged to home in good condition.  BP 132/80 mmHg  Pulse 88  Temp(Src) 98.4 F (36.9 C) (Oral)  Resp 17  Ht  (1.575 m)  Wt 213 lb (96.616 kg)  BMI 38.95 kg/m2  SpO2 99%  LMP 05/29/2015  Meds ordered this encounter  Medications  . oxyCODONE-acetaminophen (PERCOCET/ROXICET) 5-325 MG per tablet    Sig: Take 1-2 tablets by mouth every 4 (four) hours as needed for severe pain.  Marland Kitchen HYDROmorphone (DILAUDID) injection 0.5 mg    Sig:   . ketorolac (TORADOL) 30 MG/ML injection 30 mg    Sig:   . oxyCODONE-acetaminophen (PERCOCET) 5-325 MG per tablet    Sig: Take 1 tablet by mouth every 6 (six) hours as needed for severe pain.    Dispense:  10 tablet    Refill:  0    Order Specific Question:  Supervising Provider    Answer:  Hyacinth Meeker, BRIAN [[id number]]  . naproxen (NAPROSYN) 500 MG tablet    Sig: Take 1 tablet (500 mg total) by mouth 2 (two) times daily as needed for mild pain, moderate pain or headache (TAKE WITH MEALS.).    Dispense:  20 tablet    Refill:  0    Order Specific Question:  Supervising Provider    Answer:  MILLER, BRIAN [[id number]]  . ondansetron (ZOFRAN) 8 MG tablet    Sig: Take 1 tablet (8 mg total) by mouth every 8 (eight) hours as needed for nausea  or vomiting.    Dispense:  10 tablet    Refill:  0    Order Specific Question:  Supervising Provider    Answer:  Eber Hong [[id number]]     Machai Desmith Camprubi-Soms, PA-C 05/30/15 [id number]  Samuel Jester, DO 06/02/15 1451

## 2015-05-30 NOTE — ED Notes (Signed)
Patient transported to CT 

## 2015-05-30 NOTE — Discharge Instructions (Signed)
Take naprosyn as directed as needed for inflammation and pain using percocet for breakthrough pain. Do not drive or operate machinery with pain medication use. May need over-the-counter stool softener (i.e. miralax or colace) with this pain medication use. Use Zofran as needed for nausea. Use Flomax as directed, as this medication will help you pass the stone. Strain your urine to catch if the stone passes. Call the urologist tomorrow to set up an appointment this week for further management of your kidney stones, however for intractable or uncontrollable pain at home then return to the emergency department.    Kidney Stones Kidney stones (urolithiasis) are deposits that form inside your kidneys. The intense pain is caused by the stone moving through the urinary tract. When the stone moves, the ureter goes into spasm around the stone. The stone is usually passed in the urine.  CAUSES   A disorder that makes certain neck glands produce too much parathyroid hormone (primary hyperparathyroidism).  A buildup of uric acid crystals, similar to gout in your joints.  Narrowing (stricture) of the ureter.  A kidney obstruction present at birth (congenital obstruction).  Previous surgery on the kidney or ureters.  Numerous kidney infections. SYMPTOMS   Feeling sick to your stomach (nauseous).  Throwing up (vomiting).  Blood in the urine (hematuria).  Pain that usually spreads (radiates) to the groin.  Frequency or urgency of urination. DIAGNOSIS   Taking a history and physical exam.  Blood or urine tests.  CT scan.  Occasionally, an examination of the inside of the urinary bladder (cystoscopy) is performed. TREATMENT   Observation.  Increasing your fluid intake.  Extracorporeal shock wave lithotripsy--This is a noninvasive procedure that uses shock waves to break up kidney stones.  Surgery may be needed if you have severe pain or persistent obstruction. There are various surgical  procedures. Most of the procedures are performed with the use of small instruments. Only small incisions are needed to accommodate these instruments, so recovery time is minimized. The size, location, and chemical composition are all important variables that will determine the proper choice of action for you. Talk to your health care provider to better understand your situation so that you will minimize the risk of injury to yourself and your kidney.  HOME CARE INSTRUCTIONS   Drink enough water and fluids to keep your urine clear or pale yellow. This will help you to pass the stone or stone fragments.  Strain all urine through the provided strainer. Keep all particulate matter and stones for your health care provider to see. The stone causing the pain may be as small as a grain of salt. It is very important to use the strainer each and every time you pass your urine. The collection of your stone will allow your health care provider to analyze it and verify that a stone has actually passed. The stone analysis will often identify what you can do to reduce the incidence of recurrences.  Only take over-the-counter or prescription medicines for pain, discomfort, or fever as directed by your health care provider.  Make a follow-up appointment with your health care provider as directed.  Get follow-up X-rays if required. The absence of pain does not always mean that the stone has passed. It may have only stopped moving. If the urine remains completely obstructed, it can cause loss of kidney function or even complete destruction of the kidney. It is your responsibility to make sure X-rays and follow-ups are completed. Ultrasounds of the kidney can show  blockages and the status of the kidney. Ultrasounds are not associated with any radiation and can be performed easily in a matter of minutes. SEEK MEDICAL CARE IF:  You experience pain that is progressive and unresponsive to any pain medicine you have been  prescribed. SEEK IMMEDIATE MEDICAL CARE IF:   Pain cannot be controlled with the prescribed medicine.  You have a fever or shaking chills.  The severity or intensity of pain increases over 18 hours and is not relieved by pain medicine.  You develop a new onset of abdominal pain.  You feel faint or pass out.  You are unable to urinate. MAKE SURE YOU:   Understand these instructions.  Will watch your condition.  Will get help right away if you are not doing well or get worse. Document Released: 12/04/2005 Document Revised: 08/06/2013 Document Reviewed: 05/07/2013 [facility] Patient Information 2015 [place], [place]. This information is not intended to replace advice given to you by your health care provider. Make sure you discuss any questions you have with your health care provider.  Low-Purine Diet Purines are compounds that affect the level of uric acid in your body. A low-purine diet is a diet that is low in purines. Eating a low-purine diet can prevent the level of uric acid in your body from getting too high and causing gout or kidney stones or both. WHAT DO I NEED TO KNOW ABOUT THIS DIET?  Choose low-purine foods. Examples of low-purine foods are listed in the next section.  Drink plenty of fluids, especially water. Fluids can help remove uric acid from your body. Try to drink 8-16 cups (1.9-3.8 L) a day.  Limit foods high in fat, especially saturated fat, as fat makes it harder for the body to get rid of uric acid. Foods high in saturated fat include pizza, cheese, ice cream, whole milk, fried foods, and gravies. Choose foods that are lower in fat and lean sources of protein. Use olive oil when cooking as it contains healthy fats that are not high in saturated fat.  Limit alcohol. Alcohol interferes with the elimination of uric acid from your body. If you are having a gout attack, avoid all alcohol.  Keep in mind that different people's bodies react differently to different  foods. You will probably learn over time which foods do or do not affect you. If you discover that a food tends to cause your gout to flare up, avoid eating that food. You can more freely enjoy foods that do not cause problems. If you have any questions about a food item, talk to your dietitian or health care provider. WHICH FOODS ARE LOW, MODERATE, AND HIGH IN PURINES? The following is a list of foods that are low, moderate, and high in purines. You can eat any amount of the foods that are low in purines. You may be able to have small amounts of foods that are moderate in purines. Ask your health care provider how much of a food moderate in purines you can have. Avoid foods high in purines. Grains  Foods low in purines: Enriched white bread, pasta, rice, cake, cornbread, popcorn.  Foods moderate in purines: Whole-grain breads and cereals, wheat germ, bran, oatmeal. Uncooked oatmeal. Dry wheat bran or wheat germ.  Foods high in purines: Pancakes, [place] toast, biscuits, muffins. Vegetables  Foods low in purines: All vegetables, except those that are moderate in purines.  Foods moderate in purines: Asparagus, cauliflower, spinach, mushrooms, green peas. Fruits  All fruits are low in purines.  Meats and other Protein Foods  Foods low in purines: Eggs, nuts, peanut butter.  Foods moderate in purines: 80-90% lean beef, lamb, veal, pork, poultry, fish, eggs, peanut butter, nuts. Crab, lobster, oysters, and shrimp. Cooked dried beans, peas, and lentils.  Foods high in purines: Anchovies, sardines, herring, mussels, tuna, codfish, scallops, trout, and haddock. Veronica Cunningham. Organ meats (such as liver or kidney). Tripe. Game meat. Goose. Sweetbreads. Dairy  All dairy foods are low in purines. Low-fat and fat-free dairy products are best because they are low in saturated fat. Beverages  Drinks low in purines: Water, carbonated beverages, tea, coffee, cocoa.  Drinks moderate in purines: Soft drinks and  other drinks sweetened with high-fructose corn syrup. Juices. To find whether a food or drink is sweetened with high-fructose corn syrup, look at the ingredients list.  Drinks high in purines: Alcoholic beverages (such as beer). Condiments  Foods low in purines: Salt, herbs, olives, pickles, relishes, vinegar.  Foods moderate in purines: Butter, margarine, oils, mayonnaise. Fats and Oils  Foods low in purines: All types, except gravies and sauces made with meat.  Foods high in purines: Gravies and sauces made with meat. Other Foods  Foods low in purines: Sugars, sweets, gelatin. Cake. Soups made without meat.  Foods moderate in purines: Meat-based or fish-based soups, broths, or bouillons. Foods and drinks sweetened with high-fructose corn syrup.  Foods high in purines: High-fat desserts (such as ice cream, cookies, cakes, pies, doughnuts, and chocolate). Contact your dietitian for more information on foods that are not listed here. Document Released: 03/31/2011 Document Revised: 12/09/2013 Document Reviewed: 11/10/2013 [facility] Patient Information 2015 [place], [place]. This information is not intended to replace advice given to you by your health care provider. Make sure you discuss any questions you have with your health care provider.  Nausea and Vomiting Nausea means you feel sick to your stomach. Throwing up (vomiting) is a reflex where stomach contents come out of your mouth. HOME CARE   Take medicine as told by your doctor.  Do not force yourself to eat. However, you do need to drink fluids.  If you feel like eating, eat a normal diet as told by your doctor.  Eat rice, wheat, potatoes, bread, lean meats, yogurt, fruits, and vegetables.  Avoid high-fat foods.  Drink enough fluids to keep your pee (urine) clear or pale yellow.  Ask your doctor how to replace body fluid losses (rehydrate). Signs of body fluid loss (dehydration) include:  Feeling very thirsty.  Dry lips  and mouth.  Feeling dizzy.  Dark pee.  Peeing less than normal.  Feeling confused.  Fast breathing or heart rate. GET HELP RIGHT AWAY IF:   You have blood in your throw up.  You have black or bloody poop (stool).  You have a bad headache or stiff neck.  You feel confused.  You have bad belly (abdominal) pain.  You have chest pain or trouble breathing.  You do not pee at least once every 8 hours.  You have cold, clammy skin.  You keep throwing up after 24 to 48 hours.  You have a fever. MAKE SURE YOU:   Understand these instructions.  Will watch your condition.  Will get help right away if you are not doing well or get worse. Document Released: 05/22/2008 Document Revised: 02/26/2012 Document Reviewed: 05/05/2011 [facility] Patient Information 2015 [place], [place]. This information is not intended to replace advice given to you by your health care provider. Make sure you discuss any questions you have  with your health care provider.  Urine Strainer This strainer is used to catch or filter out any stones found in your urine. Place the strainer under your urine stream. Save any stones or objects that you find in your urine. Place them in a plastic or glass container to show your caregiver. The stones vary in size - some can be very small, so make sure you check the strainer carefully. Your caregiver may send the stone to the lab. When the results are back, your caregiver may recommend medicines or diet changes.  Document Released: 09/08/2004 Document Revised: 02/26/2012 Document Reviewed: 10/16/2008 [facility] Patient Information 2015 [place], [place]. This information is not intended to replace advice given to you by your health care provider. Make sure you discuss any questions you have with your health care provider.

## 2015-05-30 NOTE — ED Notes (Signed)
Pt given strainer and specimen cups. Given instructions regarding use and to bring stone if caught to urology appointment.

## 2015-06-04 ENCOUNTER — Other Ambulatory Visit: Payer: Self-pay | Admitting: Urology

## 2015-06-04 ENCOUNTER — Encounter (HOSPITAL_BASED_OUTPATIENT_CLINIC_OR_DEPARTMENT_OTHER): Payer: Self-pay | Admitting: *Deleted

## 2015-06-04 NOTE — Progress Notes (Signed)
To [facility] at 1015-lab work in chart -instructed Npo after Mn-may take pain med with small amt water as needed.

## 2015-06-07 ENCOUNTER — Ambulatory Visit (HOSPITAL_BASED_OUTPATIENT_CLINIC_OR_DEPARTMENT_OTHER)
Admission: RE | Admit: 2015-06-07 | Discharge: 2015-06-07 | Disposition: A | Discharge status: Home / Self Care | Payer: Self-pay | Source: Ambulatory Visit | Attending: Urology | Admitting: Urology

## 2015-06-07 ENCOUNTER — Ambulatory Visit (HOSPITAL_BASED_OUTPATIENT_CLINIC_OR_DEPARTMENT_OTHER): Payer: Self-pay | Admitting: Anesthesiology

## 2015-06-07 ENCOUNTER — Encounter (HOSPITAL_BASED_OUTPATIENT_CLINIC_OR_DEPARTMENT_OTHER)
Admission: RE | Disposition: A | Discharge status: Home / Self Care | Payer: Self-pay | Source: Ambulatory Visit | Attending: Urology

## 2015-06-07 ENCOUNTER — Encounter (HOSPITAL_BASED_OUTPATIENT_CLINIC_OR_DEPARTMENT_OTHER): Payer: Self-pay | Admitting: Anesthesiology

## 2015-06-07 DIAGNOSIS — Z87442 Personal history of urinary calculi: Secondary | ICD-10-CM | POA: Insufficient documentation

## 2015-06-07 DIAGNOSIS — N201 Calculus of ureter: Secondary | ICD-10-CM | POA: Insufficient documentation

## 2015-06-07 DIAGNOSIS — Z6839 Body mass index (BMI) 39.0-39.9, adult: Secondary | ICD-10-CM | POA: Insufficient documentation

## 2015-06-07 HISTORY — PX: HOLMIUM LASER APPLICATION: SHX5852

## 2015-06-07 HISTORY — PX: CYSTOSCOPY WITH URETEROSCOPY AND STENT PLACEMENT: SHX6377

## 2015-06-07 SURGERY — CYSTOURETEROSCOPY, WITH STENT INSERTION
Anesthesia: General | Site: Ureter | Laterality: Right

## 2015-06-07 MED ORDER — CEFAZOLIN SODIUM-DEXTROSE 2-3 GM-% IV SOLR
2.0000 g | INTRAVENOUS | Status: AC
  Administered 2015-06-07: 2 g via INTRAVENOUS
  Filled 2015-06-07: qty 50

## 2015-06-07 MED ORDER — IOHEXOL 300 MG/ML  SOLN
INTRAMUSCULAR | Status: DC | PRN
  Administered 2015-06-07: 7 mL

## 2015-06-07 MED ORDER — CEFAZOLIN SODIUM 1-5 GM-% IV SOLN
1.0000 g | INTRAVENOUS | Status: DC
  Filled 2015-06-07: qty 50

## 2015-06-07 MED ORDER — HYDROMORPHONE HCL 1 MG/ML IJ SOLN
INTRAMUSCULAR | Status: AC
  Filled 2015-06-07: qty 1

## 2015-06-07 MED ORDER — FENTANYL CITRATE (PF) 100 MCG/2ML IJ SOLN
INTRAMUSCULAR | Status: AC
  Filled 2015-06-07: qty 4

## 2015-06-07 MED ORDER — OXYCODONE-ACETAMINOPHEN 5-325 MG PO TABS
ORAL_TABLET | ORAL | Status: AC
  Filled 2015-06-07: qty 1

## 2015-06-07 MED ORDER — MIDAZOLAM HCL 2 MG/2ML IJ SOLN
INTRAMUSCULAR | Status: AC
  Filled 2015-06-07: qty 2

## 2015-06-07 MED ORDER — PROMETHAZINE HCL 25 MG/ML IJ SOLN
6.2500 mg | INTRAMUSCULAR | Status: DC | PRN
  Filled 2015-06-07: qty 1

## 2015-06-07 MED ORDER — CEFAZOLIN SODIUM-DEXTROSE 2-3 GM-% IV SOLR
INTRAVENOUS | Status: AC
  Filled 2015-06-07: qty 50

## 2015-06-07 MED ORDER — DEXAMETHASONE SODIUM PHOSPHATE 4 MG/ML IJ SOLN
INTRAMUSCULAR | Status: DC | PRN
  Administered 2015-06-07: 10 mg via INTRAVENOUS

## 2015-06-07 MED ORDER — ONDANSETRON HCL 4 MG/2ML IJ SOLN
INTRAMUSCULAR | Status: DC | PRN
  Administered 2015-06-07: 4 mg via INTRAVENOUS

## 2015-06-07 MED ORDER — ACETAMINOPHEN 10 MG/ML IV SOLN
INTRAVENOUS | Status: DC | PRN
  Administered 2015-06-07: 1000 mg via INTRAVENOUS

## 2015-06-07 MED ORDER — LIDOCAINE HCL (CARDIAC) 20 MG/ML IV SOLN
INTRAVENOUS | Status: DC | PRN
  Administered 2015-06-07: 60 mg via INTRAVENOUS

## 2015-06-07 MED ORDER — SCOPOLAMINE 1 MG/3DAYS TD PT72
1.0000 | MEDICATED_PATCH | TRANSDERMAL | Status: DC
  Administered 2015-06-07: 1.5 mg via TRANSDERMAL
  Filled 2015-06-07: qty 1

## 2015-06-07 MED ORDER — PROPOFOL 10 MG/ML IV BOLUS
INTRAVENOUS | Status: DC | PRN
  Administered 2015-06-07: 100 mg via INTRAVENOUS
  Administered 2015-06-07: 200 mg via INTRAVENOUS

## 2015-06-07 MED ORDER — OXYCODONE-ACETAMINOPHEN 5-325 MG PO TABS
1.0000 | ORAL_TABLET | Freq: Once | ORAL | Status: AC
  Administered 2015-06-07: 1 via ORAL
  Filled 2015-06-07: qty 1

## 2015-06-07 MED ORDER — FENTANYL CITRATE (PF) 100 MCG/2ML IJ SOLN
INTRAMUSCULAR | Status: DC | PRN
  Administered 2015-06-07 (×2): 50 ug via INTRAVENOUS

## 2015-06-07 MED ORDER — LACTATED RINGERS IV SOLN
INTRAVENOUS | Status: DC
  Administered 2015-06-07 (×3): via INTRAVENOUS
  Filled 2015-06-07: qty 1000

## 2015-06-07 MED ORDER — SODIUM CHLORIDE 0.9 % IR SOLN
Status: DC | PRN
  Administered 2015-06-07: 6500 mL

## 2015-06-07 MED ORDER — SCOPOLAMINE 1 MG/3DAYS TD PT72
MEDICATED_PATCH | TRANSDERMAL | Status: AC
  Filled 2015-06-07: qty 1

## 2015-06-07 MED ORDER — MIDAZOLAM HCL 5 MG/5ML IJ SOLN
INTRAMUSCULAR | Status: DC | PRN
  Administered 2015-06-07: 2 mg via INTRAVENOUS

## 2015-06-07 MED ORDER — HYDROMORPHONE HCL 1 MG/ML IJ SOLN
0.2500 mg | INTRAMUSCULAR | Status: DC | PRN
  Administered 2015-06-07: 0.5 mg via INTRAVENOUS
  Administered 2015-06-07: 0.25 mg via INTRAVENOUS
  Filled 2015-06-07: qty 1

## 2015-06-07 SURGICAL SUPPLY — 19 items
BAG DRAIN URO-CYSTO SKYTR STRL (DRAIN) ×2 IMPLANT
BASKET ZERO TIP NITINOL 2.4FR (BASKET) ×2 IMPLANT
CATH INTERMIT  6FR 70CM (CATHETERS) ×2 IMPLANT
CLOTH BEACON ORANGE TIMEOUT ST (SAFETY) ×2 IMPLANT
FIBER LASER TRAC TIP (UROLOGICAL SUPPLIES) ×2 IMPLANT
GLOVE BIO SURGEON STRL SZ8 (GLOVE) ×2 IMPLANT
GOWN STRL REUS W/ TWL LRG LVL3 (GOWN DISPOSABLE) ×1 IMPLANT
GOWN STRL REUS W/ TWL XL LVL3 (GOWN DISPOSABLE) ×1 IMPLANT
GOWN STRL REUS W/TWL LRG LVL3 (GOWN DISPOSABLE) ×1
GOWN STRL REUS W/TWL XL LVL3 (GOWN DISPOSABLE) ×1
GUIDEWIRE ANG ZIPWIRE 038X150 (WIRE) ×2 IMPLANT
GUIDEWIRE STR DUAL SENSOR (WIRE) ×2 IMPLANT
IV NS IRRIG 3000ML ARTHROMATIC (IV SOLUTION) ×4 IMPLANT
MANIFOLD NEPTUNE II (INSTRUMENTS) ×2 IMPLANT
NS IRRIG 500ML POUR BTL (IV SOLUTION) ×2 IMPLANT
PACK CYSTO (CUSTOM PROCEDURE TRAY) ×2 IMPLANT
STENT URET 6FRX26 CONTOUR (STENTS) ×2 IMPLANT
SYRINGE 10CC LL (SYRINGE) ×2 IMPLANT
TUBE FEEDING 8FR 16IN STR KANG (MISCELLANEOUS) IMPLANT

## 2015-06-07 NOTE — Anesthesia Postprocedure Evaluation (Signed)
Anesthesia Post Note  Patient: Veronica Cunningham  Procedure(s) Performed: Procedure(s) (LRB): CYSTOSCOPY WITH RETROGRADE PYLEGRAM, URETEROSCOPY, STONE EXTRACTION, AND STENT PLACEMENT (Right) HOLMIUM LASER APPLICATION (Right)  Anesthesia type: general  Patient location: PACU  Post pain: Pain level controlled  Post assessment: Patient's Cardiovascular Status Stable  Last Vitals:  Filed Vitals:   06/07/15 1430  BP: 137/92  Pulse: 92  Temp: 36.7 C  Resp: 16    Post vital signs: Reviewed and stable  Level of consciousness: sedated  Complications: No apparent anesthesia complications

## 2015-06-07 NOTE — Transfer of Care (Signed)
Immediate Anesthesia Transfer of Care Note  Patient: Veronica Cunningham  Procedure(s) Performed: Procedure(s): CYSTOSCOPY WITH RETROGRADE PYLEGRAM, URETEROSCOPY, STONE EXTRACTION, AND STENT PLACEMENT (Right) HOLMIUM LASER APPLICATION (Right)  Patient Location: PACU  Anesthesia Type:General  Level of Consciousness: awake, alert , oriented and patient cooperative  Airway & Oxygen Therapy: Patient Spontanous Breathing and Patient connected to nasal cannula oxygen  Post-op Assessment: Report given to RN and Post -op Vital signs reviewed and stable  Post vital signs: Reviewed and stable  Last Vitals:  Filed Vitals:   06/07/15 1019  BP: 137/94  Pulse: 121  Temp: 36.5 C  Resp: 18    Complications: No apparent anesthesia complications

## 2015-06-07 NOTE — Anesthesia Preprocedure Evaluation (Addendum)
Anesthesia Evaluation  Patient identified by MRN, date of birth, ID band Patient awake    Reviewed: Allergy & Precautions, Patient's Chart, lab work & pertinent test results  History of Anesthesia Complications Negative for: history of anesthetic complications  Airway Mallampati: II  TM Distance: >3 FB Neck ROM: Full    Dental  (+) Teeth Intact, Dental Advisory Given,    Pulmonary neg pulmonary ROS,    Pulmonary exam normal       Cardiovascular Exercise Tolerance: Good negative cardio ROS Normal cardiovascular examRhythm:Regular Rate:Normal     Neuro/Psych negative neurological ROS  negative psych ROS   GI/Hepatic negative GI ROS, Neg liver ROS,   Endo/Other  Morbid obesity  Renal/GU Renal disease6-12-16 CT abdomen Pelvis IMPRESSION: 1. Mild right-sided hydroureteronephrosis secondary to a 7 mm proximal right ureteric calculus. 2. Ectopic left kidney, positioned within the low right abdomen. 3. Bilateral nephrolithiasis.       Musculoskeletal   Abdominal   Peds  Hematology   Anesthesia Other Findings   Reproductive/Obstetrics                        Anesthesia Physical Anesthesia Plan  ASA: II  Anesthesia Plan: General   Post-op Pain Management:    Induction: Intravenous  Airway Management Planned: LMA  Additional Equipment:   Intra-op Plan:   Post-operative Plan: Extubation in OR  Informed Consent: I have reviewed the patients History and Physical, chart, labs and discussed the procedure including the risks, benefits and alternatives for the proposed anesthesia with the patient or authorized representative who has indicated his/her understanding and acceptance.   Dental advisory given  Plan Discussed with: CRNA, Anesthesiologist and Surgeon  Anesthesia Plan Comments:       Anesthesia Quick Evaluation

## 2015-06-07 NOTE — H&P (Signed)
Urology Admission H&P  Chief Complaint: right flank pain  History of Present Illness: Veronica Cunningham is a 29yo with a known right ureteral stone here for ureteroscopic stone extraction. She denies fevers/chills/sweats. She has intermittent nonradiating sharp right flank pain.  Past Medical History  Diagnosis Date  . Urinary tract infection   . Kidney stones     Hx of stones-usually passed w/out intervention   History reviewed. No pertinent past surgical history.  Home Medications:  Prescriptions prior to admission  Medication Sig Dispense Refill Last Dose  . acetaminophen (TYLENOL) 500 MG tablet Take 1,500 mg by mouth every 6 (six) hours as needed. For pain    06/06/2015 at Unknown time  . naproxen (NAPROSYN) 500 MG tablet Take 1 tablet (500 mg total) by mouth 2 (two) times daily as needed for mild pain, moderate pain or headache (TAKE WITH MEALS.). 20 tablet 0 06/06/2015 at Unknown time  . ondansetron (ZOFRAN) 8 MG tablet Take 1 tablet (8 mg total) by mouth every 8 (eight) hours as needed for nausea or vomiting. 10 tablet 0 Past Week at Unknown time  . oxyCODONE-acetaminophen (PERCOCET) 5-325 MG per tablet Take 1 tablet by mouth every 6 (six) hours as needed for severe pain. 10 tablet 0 06/06/2015 at Unknown time  . tamsulosin (FLOMAX) 0.4 MG CAPS capsule Take 0.4 mg by mouth daily.   06/06/2015 at Unknown time  . oxyCODONE-acetaminophen (PERCOCET/ROXICET) 5-325 MG per tablet Take 1-2 tablets by mouth every 4 (four) hours as needed for severe pain.   05/30/2015 at Unknown time   Allergies: No Known Allergies  History reviewed. No pertinent family history. Social History:  reports that she has never smoked. She does not have any smokeless tobacco history on file. She reports that she drinks alcohol. She reports that she does not use illicit drugs.  Review of Systems  All other systems reviewed and are negative.   Physical Exam:  Vital signs in last 24 hours: Temp:  [97.7 F (36.5 C)]  97.7 F (36.5 C) (06/20 1019) Pulse Rate:  [121] 121 (06/20 1019) Resp:  [18] 18 (06/20 1019) BP: (137)/(94) 137/94 mmHg (06/20 1019) SpO2:  [99 %] 99 % (06/20 1019) Weight:  [98.884 kg (218 lb)] 98.884 kg (218 lb) (06/20 1019) Physical Exam  Constitutional: She is oriented to person, place, and time. She appears well-developed and well-nourished. No distress.  HENT:  Head: Normocephalic and atraumatic.  Eyes: EOM are normal. Pupils are equal, round, and reactive to light.  Neck: Normal range of motion. Neck supple.  Cardiovascular: Normal rate and regular rhythm.   Respiratory: Effort normal and breath sounds normal. No respiratory distress.  GI: Soft. She exhibits no distension. There is no tenderness. There is no rebound.  Musculoskeletal: Normal range of motion.  Neurological: She is alert and oriented to person, place, and time.  Skin: Skin is warm and dry. She is not diaphoretic. No erythema.  Psychiatric: She has a normal mood and affect. Her behavior is normal. Judgment and thought content normal.    Laboratory Data:  No results found for this or any previous visit (from the past 24 hour(s)). No results found for this or any previous visit (from the past 240 hour(s)). Creatinine: No results for input(s): CREATININE in the last 168 hours.   Impression/Assessment:  Right ureteral stone  Plan:  Risks/benefits/alternatives to right laser lithotripsy was explained to the patient and she understands and wishes to proceed with surgery.  MCKENZIE, PATRICK L 06/07/2015, 12:42 PM

## 2015-06-07 NOTE — Discharge Instructions (Signed)

## 2015-06-07 NOTE — Brief Op Note (Signed)
06/07/2015  12:44 PM  PATIENT:  Veronica Cunningham  29 y.o. female  PRE-OPERATIVE DIAGNOSIS:  RIGHT URETERAL STONE  POST-OPERATIVE DIAGNOSIS:  RIGHT URETERAL STONE  PROCEDURE:  Procedure(s): CYSTOSCOPY WITH RETROGRADE PYLEGRAM, URETEROSCOPY, STONE EXTRACTION, AND STENT PLACEMENT (Right) HOLMIUM LASER APPLICATION (Right)  SURGEON:  Surgeon(s) and Role:    * Malen Gauze, MD - Primary  PHYSICIAN ASSISTANT:   ASSISTANTS: none   ANESTHESIA:   general  EBL:     BLOOD ADMINISTERED:none  DRAINS: 6/26 JJ ureteral stent without tether  LOCAL MEDICATIONS USED:  NONE  SPECIMEN:  Stone for analysis  DISPOSITION OF SPECIMEN:  PATHOLOGY  COUNTS:  YES  TOURNIQUET:  * No tourniquets in log *  DICTATION: .Other Dictation: Dictation Number [id number]  PLAN OF CARE: Discharge to home after PACU  PATIENT DISPOSITION:  PACU - hemodynamically stable.   Delay start of Pharmacological VTE agent (>24hrs) due to surgical blood loss or risk of bleeding: not applicable

## 2015-06-07 NOTE — Anesthesia Procedure Notes (Signed)
Procedure Name: LMA Insertion Date/Time: 06/07/2015 11:59 AM Performed by: Tyrone Nine Pre-anesthesia Checklist: Patient identified, Emergency Drugs available, Suction available, Patient being monitored and Timeout performed Patient Re-evaluated:Patient Re-evaluated prior to inductionOxygen Delivery Method: Circle system utilized Preoxygenation: Pre-oxygenation with 100% oxygen Intubation Type: IV induction Ventilation: Mask ventilation without difficulty LMA: LMA inserted LMA Size: 4.0 Number of attempts: 1 Airway Equipment and Method: Bite block Placement Confirmation: positive ETCO2 Tube secured with: Tape Dental Injury: Teeth and Oropharynx as per pre-operative assessment

## 2015-06-08 ENCOUNTER — Encounter (HOSPITAL_BASED_OUTPATIENT_CLINIC_OR_DEPARTMENT_OTHER): Payer: Self-pay | Admitting: Urology

## 2015-06-21 NOTE — Op Note (Signed)
Preoperative diagnosis: Right ureteral stone  Postoperative diagnosis: Same  Procedure: 1. cystoscopy 2. right retrograde pyelography 3.  Intraoperative fluoroscopy, under one hour, with interpretation 4.  Right ureteroscopic stone manipulation with laser lithotripsy 5.  Right 6 x 26 JJ stent placement  Attending: Wilkie AyePatrick Evah Rashid  Anesthesia: General  Estimated blood loss: None  Drains: Right 6 x 26 JJ ureteral stent without tether  Antibiotics: Ancef  Findings: Right cross fused renal ectopia. Right mid ureteral stone with proximal hydroureteronephrosis. Ureteral orifices in normal anatomic location.  Indications: Patient is a 29 year old female/female with a history of right ureteral stone and who has failed medical expulsive therapy.  After discussing treatment options, she decided proceed with right ureteroscopic stone manipulation.  Procedure her in detail: The patient was brought to the operating room and a brief timeout was done to ensure correct patient, correct procedure, correct site.  General anesthesia was administered patient was placed in dorsal lithotomy position.  Her genitalia was then prepped and draped in usual sterile fashion.  A rigid 22 French cystoscope was passed in the urethra and the bladder.  Bladder was inspected free masses or lesions.  the right ureteral orifices were in the normal orthotopic locations.  a 6 french ureteral catheter was then instilled into the right ureter orifice.  a gentle retrograde was obtained and findings noted above.  we then placed a zip wire through the ureteral catheter and advanced up to the renal pelvis.  we then removed the cystoscope and cannulated the right ureteral orifice with a semirigid ureteroscope.  we then encountered the stone in the mid ureter.  using using a 200 nm laser fiber and fragmented the stone into smaller pieces.  the pieces were then removed with a engage basket.  We then placed and good coil was noted in the the  renal pelvis under fluoroscopy and the bladder under direct vision.   once all stone fragments were removed we then placed a 6 x 26 double-j ureteral stent over the original zip wire.  the stone fragments were then removed from the bladder and sent for analysis.   the bladder was then drained and this concluded the procedure which was well tolerated by patient.  Complications: None  Condition: Stable, extubated, transferred to PACU  Plan: Patient is to be discharged home as to follow-up in one week for stent removal.

## 2015-07-26 ENCOUNTER — Other Ambulatory Visit: Payer: Self-pay | Admitting: Urology

## 2015-09-06 ENCOUNTER — Encounter (HOSPITAL_BASED_OUTPATIENT_CLINIC_OR_DEPARTMENT_OTHER): Payer: Self-pay | Admitting: *Deleted

## 2015-09-08 ENCOUNTER — Encounter (HOSPITAL_BASED_OUTPATIENT_CLINIC_OR_DEPARTMENT_OTHER): Payer: Self-pay | Admitting: *Deleted

## 2015-09-08 NOTE — Progress Notes (Signed)
NPO AFTER MN. ARRIVE AT 1000.  NEEDS HG AND URINE PREG.  

## 2015-09-13 ENCOUNTER — Ambulatory Visit (HOSPITAL_BASED_OUTPATIENT_CLINIC_OR_DEPARTMENT_OTHER): Payer: Self-pay | Admitting: Anesthesiology

## 2015-09-13 ENCOUNTER — Ambulatory Visit (HOSPITAL_BASED_OUTPATIENT_CLINIC_OR_DEPARTMENT_OTHER)
Admission: RE | Admit: 2015-09-13 | Discharge: 2015-09-13 | Disposition: A | Discharge status: Home / Self Care | Payer: Self-pay | Source: Ambulatory Visit | Attending: Urology | Admitting: Urology

## 2015-09-13 ENCOUNTER — Encounter (HOSPITAL_BASED_OUTPATIENT_CLINIC_OR_DEPARTMENT_OTHER): Payer: Self-pay

## 2015-09-13 ENCOUNTER — Encounter (HOSPITAL_BASED_OUTPATIENT_CLINIC_OR_DEPARTMENT_OTHER)
Admission: RE | Disposition: A | Discharge status: Home / Self Care | Payer: Self-pay | Source: Ambulatory Visit | Attending: Urology

## 2015-09-13 DIAGNOSIS — Z87442 Personal history of urinary calculi: Secondary | ICD-10-CM | POA: Insufficient documentation

## 2015-09-13 DIAGNOSIS — N200 Calculus of kidney: Secondary | ICD-10-CM | POA: Insufficient documentation

## 2015-09-13 DIAGNOSIS — Z6839 Body mass index (BMI) 39.0-39.9, adult: Secondary | ICD-10-CM | POA: Insufficient documentation

## 2015-09-13 HISTORY — DX: Calculus of kidney: N20.0

## 2015-09-13 HISTORY — DX: Personal history of urinary calculi: Z87.442

## 2015-09-13 HISTORY — PX: CYSTOSCOPY WITH RETROGRADE PYELOGRAM, URETEROSCOPY AND STENT PLACEMENT: SHX5789

## 2015-09-13 LAB — POCT HEMOGLOBIN-HEMACUE: HEMOGLOBIN: 11 g/dL — AB (ref 12.0–15.0)

## 2015-09-13 LAB — POCT PREGNANCY, URINE: PREG TEST UR: NEGATIVE

## 2015-09-13 SURGERY — CYSTOURETEROSCOPY, WITH RETROGRADE PYELOGRAM AND STENT INSERTION
Anesthesia: General | Site: Renal | Laterality: Right

## 2015-09-13 MED ORDER — MEPERIDINE HCL 25 MG/ML IJ SOLN
6.2500 mg | INTRAMUSCULAR | Status: DC | PRN
  Filled 2015-09-13: qty 1

## 2015-09-13 MED ORDER — FENTANYL CITRATE (PF) 100 MCG/2ML IJ SOLN
25.0000 ug | INTRAMUSCULAR | Status: DC | PRN
  Filled 2015-09-13: qty 1

## 2015-09-13 MED ORDER — FENTANYL CITRATE (PF) 100 MCG/2ML IJ SOLN
INTRAMUSCULAR | Status: DC | PRN
  Administered 2015-09-13 (×8): 25 ug via INTRAVENOUS

## 2015-09-13 MED ORDER — LACTATED RINGERS IV SOLN
INTRAVENOUS | Status: DC
  Administered 2015-09-13 (×2): via INTRAVENOUS
  Filled 2015-09-13: qty 1000

## 2015-09-13 MED ORDER — FENTANYL CITRATE (PF) 100 MCG/2ML IJ SOLN
INTRAMUSCULAR | Status: AC
  Filled 2015-09-13: qty 6

## 2015-09-13 MED ORDER — PROPOFOL 10 MG/ML IV BOLUS
INTRAVENOUS | Status: DC | PRN
  Administered 2015-09-13: 200 mg via INTRAVENOUS

## 2015-09-13 MED ORDER — PROMETHAZINE HCL 25 MG/ML IJ SOLN
6.2500 mg | INTRAMUSCULAR | Status: DC | PRN
  Filled 2015-09-13: qty 1

## 2015-09-13 MED ORDER — LIDOCAINE HCL (CARDIAC) 20 MG/ML IV SOLN
INTRAVENOUS | Status: DC | PRN
  Administered 2015-09-13: 80 mg via INTRAVENOUS

## 2015-09-13 MED ORDER — DIAZEPAM 5 MG PO TABS: 5.0000 mg | ORAL_TABLET | Freq: Three times a day (TID) | ORAL | Status: DC | PRN

## 2015-09-13 MED ORDER — KETOROLAC TROMETHAMINE 30 MG/ML IJ SOLN
INTRAMUSCULAR | Status: DC | PRN
  Administered 2015-09-13: 30 mg via INTRAVENOUS

## 2015-09-13 MED ORDER — ONDANSETRON HCL 4 MG/2ML IJ SOLN
INTRAMUSCULAR | Status: DC | PRN
  Administered 2015-09-13: 4 mg via INTRAVENOUS

## 2015-09-13 MED ORDER — ACETAMINOPHEN 10 MG/ML IV SOLN
INTRAVENOUS | Status: DC | PRN
  Administered 2015-09-13: 1000 mg via INTRAVENOUS

## 2015-09-13 MED ORDER — CEFAZOLIN SODIUM-DEXTROSE 2-3 GM-% IV SOLR
INTRAVENOUS | Status: AC
  Filled 2015-09-13: qty 50

## 2015-09-13 MED ORDER — CEFAZOLIN SODIUM 1-5 GM-% IV SOLN
1.0000 g | INTRAVENOUS | Status: DC
  Filled 2015-09-13: qty 50

## 2015-09-13 MED ORDER — CEFAZOLIN SODIUM-DEXTROSE 2-3 GM-% IV SOLR
2.0000 g | INTRAVENOUS | Status: AC
  Administered 2015-09-13: 2 g via INTRAVENOUS
  Filled 2015-09-13: qty 50

## 2015-09-13 MED ORDER — IOHEXOL 350 MG/ML SOLN
INTRAVENOUS | Status: DC | PRN
Start: 2015-09-13 — End: 2015-09-13
  Administered 2015-09-13: 5 mL

## 2015-09-13 MED ORDER — DEXAMETHASONE SODIUM PHOSPHATE 10 MG/ML IJ SOLN
INTRAMUSCULAR | Status: DC | PRN
  Administered 2015-09-13: 10 mg via INTRAVENOUS

## 2015-09-13 MED ORDER — MIDAZOLAM HCL 5 MG/5ML IJ SOLN
INTRAMUSCULAR | Status: DC | PRN
  Administered 2015-09-13 (×2): 1 mg via INTRAVENOUS

## 2015-09-13 MED ORDER — SULFAMETHOXAZOLE-TRIMETHOPRIM 800-160 MG PO TABS
1.0000 | ORAL_TABLET | Freq: Two times a day (BID) | ORAL | Status: DC
Start: 2015-09-13 — End: 2020-09-19

## 2015-09-13 MED ORDER — SODIUM CHLORIDE 0.9 % IR SOLN
Status: DC | PRN
  Administered 2015-09-13: 4000 mL

## 2015-09-13 MED ORDER — LACTATED RINGERS IV SOLN
INTRAVENOUS | Status: DC
  Filled 2015-09-13: qty 1000

## 2015-09-13 MED ORDER — OXYCODONE-ACETAMINOPHEN 5-325 MG PO TABS: 1.0000 | ORAL_TABLET | ORAL | Status: DC | PRN

## 2015-09-13 MED ORDER — MIDAZOLAM HCL 2 MG/2ML IJ SOLN
INTRAMUSCULAR | Status: AC
  Filled 2015-09-13: qty 2

## 2015-09-13 SURGICAL SUPPLY — 27 items
BAG URO CATCHER STRL LF (DRAPE) ×3 IMPLANT
BASKET LASER NITINOL 1.9FR (BASKET) IMPLANT
BASKET STONE 1.7 NGAGE (UROLOGICAL SUPPLIES) ×3 IMPLANT
BASKET ZERO TIP NITINOL 2.4FR (BASKET) IMPLANT
CANISTER SUCT LVC 12 LTR MEDI- (MISCELLANEOUS) IMPLANT
CATH INTERMIT  6FR 70CM (CATHETERS) ×3 IMPLANT
CLOTH BEACON ORANGE TIMEOUT ST (SAFETY) ×3 IMPLANT
FIBER LASER FLEXIVA 365 (UROLOGICAL SUPPLIES) IMPLANT
FIBER LASER TRAC TIP (UROLOGICAL SUPPLIES) IMPLANT
GLOVE BIO SURGEON STRL SZ 6.5 (GLOVE) ×3 IMPLANT
GLOVE BIO SURGEON STRL SZ8 (GLOVE) ×3 IMPLANT
GLOVE BIOGEL PI IND STRL 6.5 (GLOVE) ×4 IMPLANT
GLOVE BIOGEL PI INDICATOR 6.5 (GLOVE) ×2
GOWN STRL REUS W/ TWL LRG LVL3 (GOWN DISPOSABLE) ×2 IMPLANT
GOWN STRL REUS W/ TWL XL LVL3 (GOWN DISPOSABLE) ×2 IMPLANT
GOWN STRL REUS W/TWL LRG LVL3 (GOWN DISPOSABLE) ×1
GOWN STRL REUS W/TWL XL LVL3 (GOWN DISPOSABLE) ×1
GUIDEWIRE ANG ZIPWIRE 038X150 (WIRE) ×3 IMPLANT
GUIDEWIRE STR DUAL SENSOR (WIRE) IMPLANT
IV NS 1000ML (IV SOLUTION) ×1
IV NS 1000ML BAXH (IV SOLUTION) ×2 IMPLANT
IV NS IRRIG 3000ML ARTHROMATIC (IV SOLUTION) ×3 IMPLANT
MANIFOLD NEPTUNE II (INSTRUMENTS) IMPLANT
PACK CYSTO (CUSTOM PROCEDURE TRAY) ×3 IMPLANT
STENT URET 6FRX26 CONTOUR (STENTS) ×3 IMPLANT
SYRINGE 10CC LL (SYRINGE) ×3 IMPLANT
TUBE FEEDING 8FR 16IN STR KANG (MISCELLANEOUS) IMPLANT

## 2015-09-13 NOTE — Anesthesia Preprocedure Evaluation (Signed)
Anesthesia Evaluation  Patient identified by MRN, date of birth, ID band Patient awake    Reviewed: Allergy & Precautions, Patient's Chart, lab work & pertinent test results  History of Anesthesia Complications Negative for: history of anesthetic complications  Airway Mallampati: II  TM Distance: >3 FB Neck ROM: Full    Dental  (+) Teeth Intact, Dental Advisory Given,    Pulmonary neg pulmonary ROS,    Pulmonary exam normal        Cardiovascular Exercise Tolerance: Good negative cardio ROS Normal cardiovascular exam Rhythm:Regular Rate:Normal     Neuro/Psych negative neurological ROS  negative psych ROS   GI/Hepatic negative GI ROS, Neg liver ROS,   Endo/Other  Morbid obesity  Renal/GU Renal disease      Musculoskeletal   Abdominal   Peds  Hematology   Anesthesia Other Findings   Reproductive/Obstetrics                             Anesthesia Physical  Anesthesia Plan  ASA: II  Anesthesia Plan: General   Post-op Pain Management:    Induction: Intravenous  Airway Management Planned: LMA  Additional Equipment:   Intra-op Plan:   Post-operative Plan: Extubation in OR  Informed Consent: I have reviewed the patients History and Physical, chart, labs and discussed the procedure including the risks, benefits and alternatives for the proposed anesthesia with the patient or authorized representative who has indicated his/her understanding and acceptance.   Dental advisory given  Plan Discussed with: CRNA, Anesthesiologist and Surgeon  Anesthesia Plan Comments:         Anesthesia Quick Evaluation

## 2015-09-13 NOTE — Anesthesia Procedure Notes (Signed)
Procedure Name: LMA Insertion Date/Time: 09/13/2015 12:10 PM Performed by: Jessica Priest Pre-anesthesia Checklist: Patient identified, Emergency Drugs available, Suction available and Patient being monitored Patient Re-evaluated:Patient Re-evaluated prior to inductionOxygen Delivery Method: Circle System Utilized Preoxygenation: Pre-oxygenation with 100% oxygen Intubation Type: IV induction Ventilation: Mask ventilation without difficulty LMA: LMA inserted LMA Size: 4.0 Number of attempts: 1 Airway Equipment and Method: Bite block Placement Confirmation: positive ETCO2 Tube secured with: Tape Dental Injury: Teeth and Oropharynx as per pre-operative assessment

## 2015-09-13 NOTE — H&P (Signed)
Urology Admission H&P  Chief Complaint: bilateral renal calculi  History of Present Illness: Veronica Cunningham is a 29yo with a hx of nephrolithiasis who presents for right renal stone extraction. She s/p R ureteral stone extraction 2 months ago. CaOx was stone composition. She has a hx of right cross fused ectopia. She denies any flank pain.  Past Medical History  Diagnosis Date  . History of kidney stones   . Renal calculi     bilateral   Past Surgical History  Procedure Laterality Date  . Cystoscopy with ureteroscopy and stent placement Right 06/07/2015    Procedure: CYSTOSCOPY WITH RETROGRADE PYLEGRAM, URETEROSCOPY, STONE EXTRACTION, AND STENT PLACEMENT;  Surgeon: Malen Gauze, MD;  Location: [facility];  Service: Urology;  Laterality: Right;  . Holmium laser application Right 06/07/2015    Procedure: HOLMIUM LASER APPLICATION;  Surgeon: Malen Gauze, MD;  Location: [facility];  Service: Urology;  Laterality: Right;    Home Medications:  Prescriptions prior to admission  Medication Sig Dispense Refill Last Dose  . ibuprofen (ADVIL,MOTRIN) 200 MG tablet Take 200 mg by mouth every 6 (six) hours as needed.   Past Month at Unknown time   Allergies: Not on File  History reviewed. No pertinent family history. Social History:  reports that she has never smoked. She has never used smokeless tobacco. She reports that she drinks alcohol. She reports that she does not use illicit drugs.  Review of Systems  All other systems reviewed and are negative.   Physical Exam:  Vital signs in last 24 hours: Temp:  [98.2 F (36.8 C)] 98.2 F (36.8 C) (09/26 0955) Pulse Rate:  [105] 105 (09/26 0955) Resp:  [16] 16 (09/26 0955) BP: (168)/(73) 168/73 mmHg (09/26 0955) SpO2:  [100 %] 100 % (09/26 0955) Weight:  [97.523 kg (215 lb)] 97.523 kg (215 lb) (09/26 0955) Physical Exam  Constitutional: She is oriented to person, place, and time. She appears  well-developed and well-nourished.  HENT:  Head: Normocephalic and atraumatic.  Eyes: EOM are normal. Pupils are equal, round, and reactive to light.  Neck: Normal range of motion. No thyromegaly present.  Cardiovascular: Normal rate and regular rhythm.   Respiratory: Effort normal. No respiratory distress.  GI: Soft. She exhibits no distension.  Musculoskeletal: Normal range of motion.  Neurological: She is alert and oriented to person, place, and time.  Skin: Skin is warm and dry.  Psychiatric: She has a normal mood and affect. Her behavior is normal. Judgment and thought content normal.    Laboratory Data:  Results for orders placed or performed during the hospital encounter of 09/13/15 (from the past 24 hour(s))  Pregnancy, urine POC     Status: None   Collection Time: 09/13/15  9:55 AM  Result Value Ref Range   Preg Test, Ur NEGATIVE NEGATIVE  Hemoglobin-hemacue, POC     Status: Abnormal   Collection Time: 09/13/15 10:20 AM  Result Value Ref Range   Hemoglobin 11.0 (L) 12.0 - 15.0 g/dL   No results found for this or any previous visit (from the past 240 hour(s)). Creatinine: No results for input(s): CREATININE in the last 168 hours. Baseline Creatinine: unknown  Impression/Assessment:  29yo with right renal stones  Plan:  The risks/beenfits/alternatives to right ureteroscopic stone extraction was explained to the patient and she understands and wishes to proceed with surgery  MCKENZIE, PATRICK L 09/13/2015, 12:37 PM

## 2015-09-13 NOTE — Discharge Instructions (Signed)
Please remove stent in 72 hours by pulling string  [facility] [phone] Post Ureteroscopy With or Without Stent Instructions  Definitions:  Ureter: The duct that transports urine from the kidney to the bladder. Stent:   A plastic hollow tube that is placed into the ureter, from the kidney to the                 bladder to prevent the ureter from swelling shut.  GENERAL INSTRUCTIONS:  Despite the fact that no skin incisions were used, the area around the ureter and bladder is raw and irritated. The stent is a foreign body which will further irritate the bladder wall. This irritation is manifested by increased frequency of urination, both day and night, and by an increase in the urge to urinate. In some, the urge to urinate is present almost always. Sometimes the urge is strong enough that you may not be able to stop yourself from urinating. The only real cure is to remove the stent and then give time for the bladder wall to heal which can't be done until the danger of the ureter swelling shut has passed, which varies.  You may see some blood in your urine while the stent is in place and a few days afterwards. Do not be alarmed, even if the urine was clear for a while. Get off your feet and drink lots of fluids until clearing occurs. If you start to pass clots or don't improve, call us.  DIET: You may return to your normal diet immediately. Because of the raw surface of your bladder, alcohol, spicy foods, acid type foods and drinks with caffeine may cause irritation or frequency and should be used in moderation. To keep your urine flowing freely and to avoid constipation, drink plenty of fluids during the day ( 8-10 glasses ). Tip: Avoid cranberry juice because it is very acidic.  ACTIVITY: Your physical activity doesn't need to be restricted. However, if you are very active, you may see some blood in your urine. We suggest that you reduce your activity under these  circumstances until the bleeding has stopped.  BOWELS: It is important to keep your bowels regular during the postoperative period. Straining with bowel movements can cause bleeding. A bowel movement every other day is reasonable. Use a mild laxative if needed, such as Milk of Magnesia 2-3 tablespoons, or 2 Dulcolax tablets. Call if you continue to have problems. If you have been taking narcotics for pain, before, during or after your surgery, you may be constipated. Take a laxative if necessary.   MEDICATION: You should resume your pre-surgery medications unless told not to. In addition you will often be given an antibiotic to prevent infection. These should be taken as prescribed until the bottles are finished unless you are having an unusual reaction to one of the drugs.  PROBLEMS YOU SHOULD REPORT TO [place]:  Fevers over 100.5 Fahrenheit.  Heavy bleeding, or clots ( See above notes about blood in urine ).  Inability to urinate.  Drug reactions ( hives, rash, nausea, vomiting, diarrhea ).  Severe burning or pain with urination that is not improving.  FOLLOW-UP: You will need a follow-up appointment to monitor your progress. Call for this appointment at the number listed above. Usually the first appointment will be about three to fourteen days after your surgery.   Post Anesthesia Home Care Instructions  Activity: Get plenty of rest for the remainder of the day. A responsible adult should stay with you  for 24 hours following the procedure.  For the next 24 hours, DO NOT: -Drive a car -Advertising copywriter -Drink alcoholic beverages -Take any medication unless instructed by your physician -Make any legal decisions or sign important papers.  Meals: Start with liquid foods such as gelatin or soup. Progress to regular foods as tolerated. Avoid greasy, spicy, heavy foods. If nausea and/or vomiting occur, drink only clear liquids until the nausea and/or vomiting subsides. Call your  physician if vomiting continues.  Special Instructions/Symptoms: Your throat may feel dry or sore from the anesthesia or the breathing tube placed in your throat during surgery. If this causes discomfort, gargle with warm salt water. The discomfort should disappear within 24 hours.  If you had a scopolamine patch placed behind your ear for the management of post- operative nausea and/or vomiting:  1. The medication in the patch is effective for 72 hours, after which it should be removed.  Wrap patch in a tissue and discard in the trash. Wash hands thoroughly with soap and water. 2. You may remove the patch earlier than 72 hours if you experience unpleasant side effects which may include dry mouth, dizziness or visual disturbances. 3. Avoid touching the patch. Wash your hands with soap and water after contact with the patch.

## 2015-09-13 NOTE — Op Note (Signed)
Preoperative diagnosis: Right renal stone  Postoperative diagnosis: Same  Procedure: 1 cystoscopy 2 right retrograde pyelography 3.  Intraoperative fluoroscopy, under one hour, with interpretation 4.  Right ureteroscopic stone manipulation with basket extraction 5.  Right 6 x 26 JJ stent placement  Attending: Cleda Mccreedy  Anesthesia: General  Estimated blood loss: None  Drains: Right 6 x 26 JJ ureteral stent with tether  Specimens: stone  Antibiotics: ancef  Findings: Right lower pole stone. No hydronephrosis. No masses/lesions in the bladder. Ureteral orifices in normal anatomic location.  Indications: Patient is a 29 year old female with a history of right renal stone and who has persistent right flank pain.  After discussing treatment options, she decided proceed with right ureteroscopic stone manipulation.  Procedure her in detail: The patient was brought to the operating room and a brief timeout was done to ensure correct patient, correct procedure, correct site.  General anesthesia was administered patient was placed in dorsal lithotomy position.  Her genitalia was then prepped and draped in usual sterile fashion.  A rigid 22 French cystoscope was passed in the urethra and the bladder.  Bladder was inspected free masses or lesions.  the right ureteral orifices were in the normal orthotopic locations.  a 6 french ureteral catheter was then instilled into the right ureter orifice.  a gentle retrograde was obtained and findings noted above.  we then placed a zip wire through the ureteral catheter and advanced up to the renal pelvis.  we then removed the cystoscope and cannulated the right ureteral orifice with a semirigid ureteroscope.  No stone was found in the ureter. Once we reached the UPJ a sensor wire was advanced in to the renal pelvis. We then removed the ureteroscope and advanced a 12/14 x 38cm access sheath up to the renal pelvis. a flexible ureteroscope was then used to  perform nephroscopy. We encountered multiple stones in the lowers.  The stones were then removed with a Ngage basket. Once the stones were removed we removed the access sheath under direct vision and noted no injury to the right ureter. we then placed a 6 x 26 double-j ureteral stent over the original zip wire. We then removed the wire and good coil was noted in the the renal pelvis under fluoroscopy and the bladder under direct vision.    the bladder was then drained and this concluded the procedure which was well tolerated by patient.  Complications: None  Condition: Stable, extubated, transferred to PACU  Plan: Patient is to be discharged home as to follow-up in one week. She is to remove her stent in 72 hours

## 2015-09-13 NOTE — Brief Op Note (Signed)
09/13/2015  12:40 PM  PATIENT:  Veronica Cunningham  29 y.o. female  PRE-OPERATIVE DIAGNOSIS:  BILATERAL RENAL STONES  POST-OPERATIVE DIAGNOSIS:  BILATERAL RENAL STONES  PROCEDURE:  Procedure(s): CYSTOSCOPY WITH RETROGRADE PYELOGRAM, URETEROSCOPY,STONE EXTRACTION AND STENT PLACEMENT (Right)  SURGEON:  Surgeon(s) and Role:    * Malen Gauze, MD - Primary  PHYSICIAN ASSISTANT:   ASSISTANTS: none   ANESTHESIA:   general  EBL:  Total I/O In: 600 [I.V.:600] Out: -   BLOOD ADMINISTERED:none  DRAINS: right 6x26 JJ stent with tether  LOCAL MEDICATIONS USED:  NONE  SPECIMEN:  Source of Specimen:  stone  DISPOSITION OF SPECIMEN:  N/A  COUNTS:  YES  TOURNIQUET:  * No tourniquets in log *  DICTATION: .Note written in EPIC  PLAN OF CARE: Discharge to home after PACU  PATIENT DISPOSITION:  PACU - hemodynamically stable.   Delay start of Pharmacological VTE agent (>24hrs) due to surgical blood loss or risk of bleeding: not applicable

## 2015-09-13 NOTE — Transfer of Care (Signed)
Immediate Anesthesia Transfer of Care Note  Patient: Veronica Cunningham  Procedure(s) Performed: Procedure(s) (LRB): CYSTOSCOPY WITH RETROGRADE PYELOGRAM, URETEROSCOPY,STONE EXTRACTION AND STENT PLACEMENT (Right)  Patient Location: PACU  Anesthesia Type: General  Level of Consciousness: awake, sedated, patient cooperative and responds to stimulation  Airway & Oxygen Therapy: Patient Spontanous Breathing and Patient connected to face mask oxygen  Post-op Assessment: Report given to PACU RN, Post -op Vital signs reviewed and stable and Patient moving all extremities  Post vital signs: Reviewed and stable  Complications: No apparent anesthesia complications

## 2015-09-14 ENCOUNTER — Encounter (HOSPITAL_BASED_OUTPATIENT_CLINIC_OR_DEPARTMENT_OTHER): Payer: Self-pay | Admitting: Urology

## 2015-09-14 NOTE — Anesthesia Postprocedure Evaluation (Signed)
  Anesthesia Post-op Note  Patient: Veronica Cunningham  Procedure(s) Performed: Procedure(s) (LRB): CYSTOSCOPY WITH RETROGRADE PYELOGRAM, URETEROSCOPY,STONE EXTRACTION AND STENT PLACEMENT (Right)  Patient Location: PACU  Anesthesia Type: General  Level of Consciousness: awake and alert   Airway and Oxygen Therapy: Patient Spontanous Breathing  Post-op Pain: mild  Post-op Assessment: Post-op Vital signs reviewed, Patient's Cardiovascular Status Stable, Respiratory Function Stable, Patent Airway and No signs of Nausea or vomiting  Last Vitals:  Filed Vitals:   09/13/15 1430  BP: 136/79  Pulse: 93  Temp: 36.5 C  Resp: 18    Post-op Vital Signs: stable   Complications: No apparent anesthesia complications

## 2017-10-14 ENCOUNTER — Encounter (HOSPITAL_COMMUNITY): Payer: Self-pay | Admitting: Nurse Practitioner

## 2017-10-14 ENCOUNTER — Emergency Department (HOSPITAL_COMMUNITY)
Admission: EM | Admit: 2017-10-14 | Discharge: 2017-10-14 | Disposition: A | Discharge status: Home / Self Care | Payer: Self-pay | Attending: Emergency Medicine | Admitting: Emergency Medicine

## 2017-10-14 ENCOUNTER — Emergency Department (HOSPITAL_COMMUNITY): Payer: Self-pay

## 2017-10-14 DIAGNOSIS — Z79899 Other long term (current) drug therapy: Secondary | ICD-10-CM | POA: Insufficient documentation

## 2017-10-14 DIAGNOSIS — N200 Calculus of kidney: Secondary | ICD-10-CM | POA: Insufficient documentation

## 2017-10-14 LAB — PREGNANCY, URINE: Preg Test, Ur: NEGATIVE

## 2017-10-14 LAB — URINALYSIS, ROUTINE W REFLEX MICROSCOPIC
Bilirubin Urine: NEGATIVE
Glucose, UA: NEGATIVE mg/dL
HGB URINE DIPSTICK: NEGATIVE
Ketones, ur: 20 mg/dL — AB
LEUKOCYTES UA: NEGATIVE
NITRITE: NEGATIVE
Protein, ur: NEGATIVE mg/dL
SPECIFIC GRAVITY, URINE: 1.018 (ref 1.005–1.030)
pH: 5 (ref 5.0–8.0)

## 2017-10-14 LAB — CBC
HEMATOCRIT: 35.9 % — AB (ref 36.0–46.0)
Hemoglobin: 10.9 g/dL — ABNORMAL LOW (ref 12.0–15.0)
MCH: 23.9 pg — AB (ref 26.0–34.0)
MCHC: 30.4 g/dL (ref 30.0–36.0)
MCV: 78.6 fL (ref 78.0–100.0)
Platelets: 328 10*3/uL (ref 150–400)
RBC: 4.57 MIL/uL (ref 3.87–5.11)
RDW: 17.1 % — ABNORMAL HIGH (ref 11.5–15.5)
WBC: 11.1 10*3/uL — AB (ref 4.0–10.5)

## 2017-10-14 LAB — BASIC METABOLIC PANEL
Anion gap: 9 (ref 5–15)
BUN: 10 mg/dL (ref 6–20)
CHLORIDE: 104 mmol/L (ref 101–111)
CO2: 23 mmol/L (ref 22–32)
Calcium: 9.6 mg/dL (ref 8.9–10.3)
Creatinine, Ser: 0.73 mg/dL (ref 0.44–1.00)
GFR calc non Af Amer: >60 mL/min (ref >60)
Glucose, Bld: 100 mg/dL — ABNORMAL HIGH (ref 65–99)
POTASSIUM: 3.8 mmol/L (ref 3.5–5.1)
SODIUM: 136 mmol/L (ref 135–145)

## 2017-10-14 MED ORDER — HYDROMORPHONE HCL 1 MG/ML IJ SOLN
1.0000 mg | Freq: Once | INTRAMUSCULAR | Status: AC
  Administered 2017-10-14: 1 mg via INTRAVENOUS
  Filled 2017-10-14: qty 1

## 2017-10-14 MED ORDER — SODIUM CHLORIDE 0.9 % IV SOLN
INTRAVENOUS | Status: DC
  Administered 2017-10-14: 23:00:00 via INTRAVENOUS

## 2017-10-14 MED ORDER — ONDANSETRON HCL 4 MG/2ML IJ SOLN
4.0000 mg | Freq: Once | INTRAMUSCULAR | Status: AC
  Administered 2017-10-14: 4 mg via INTRAVENOUS
  Filled 2017-10-14: qty 2

## 2017-10-14 MED ORDER — SODIUM CHLORIDE 0.9 % IV BOLUS (SEPSIS)
500.0000 mL | Freq: Once | INTRAVENOUS | Status: AC
Start: 2017-10-14 — End: 2017-10-14
  Administered 2017-10-14: 500 mL via INTRAVENOUS

## 2017-10-14 MED ORDER — HYDROCODONE-ACETAMINOPHEN 5-325 MG PO TABS: 1.0000 | ORAL_TABLET | Freq: Four times a day (QID) | ORAL | 0 refills | Status: DC | PRN

## 2017-10-14 NOTE — Discharge Instructions (Signed)
[facility]  Today's CT shows evidence of stones up in the kidneys but not in the ureters.  No significant lab abnormalities.  Take pain medicine as directed.

## 2017-10-14 NOTE — ED Notes (Signed)
Bed: WA06 Expected date:  Expected time:  Means of arrival:  Comments: 

## 2017-10-14 NOTE — ED Triage Notes (Signed)
Pt reports an extensive kidney problems hx, chart review collaborates it and she adds that she has been unable to seek care for the last 2 years owing to financial problems. She is c/o left flank pain that she is seeking evaluation for before she is bale to see her urologist. Denies fever/chills N/V/Dysuria.

## 2017-10-14 NOTE — ED Provider Notes (Signed)
[facility]-EMERGENCY DEPT Provider Note   CSN: [id number] Arrival date & time: 10/14/17  1602     History   Chief Complaint Chief Complaint  Patient presents with  . Nephrolithiasis    HPI Veronica Cunningham is a 31 y.o. female.  Patient with a history of an ectopic kidney.  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  But ureter system moves over to the left side.  Reviewed her records here.  Patient has had ureteral stones in the past and has had to have them removed and stented.  Patient states that they remove stones even that her up in her kidneys but I cannot see any record evidence of that.  Started with left-sided flank pain at 1400 today.  Started to have some nausea at 2300 yesterday.  States pain is 8 out of 10.  No vomiting.  No fevers.  Current symptoms remind her of her renal colic.  Last episode was in 2016.      Past Medical History:  Diagnosis Date  . History of kidney stones   . Renal calculi    bilateral    There are no active problems to display for this patient.   Past Surgical History:  Procedure Laterality Date  . CYSTOSCOPY WITH RETROGRADE PYELOGRAM, URETEROSCOPY AND STENT PLACEMENT Right 09/13/2015   Procedure: CYSTOSCOPY WITH RETROGRADE PYELOGRAM, URETEROSCOPY,STONE EXTRACTION AND STENT PLACEMENT;  Surgeon: Malen Gauze, MD;  Location: [facility];  Service: Urology;  Laterality: Right;  . CYSTOSCOPY WITH URETEROSCOPY AND STENT PLACEMENT Right 06/07/2015   Procedure: CYSTOSCOPY WITH RETROGRADE PYLEGRAM, URETEROSCOPY, STONE EXTRACTION, AND STENT PLACEMENT;  Surgeon: Malen Gauze, MD;  Location: [facility];  Service: Urology;  Laterality: Right;  . HOLMIUM LASER APPLICATION Right 06/07/2015   Procedure: HOLMIUM LASER APPLICATION;  Surgeon: Malen Gauze, MD;  Location: [facility];  Service: Urology;  Laterality: Right;    OB History    No data available         Home Medications    Prior to Admission medications   Medication Sig Start Date End Date Taking? Authorizing Provider  cetirizine (ZYRTEC) 10 MG tablet Take 10 mg by mouth daily.   Yes [provider]  ibuprofen (ADVIL,MOTRIN) 200 MG tablet Take 200 mg by mouth every 6 (six) hours as needed.   Yes [provider]  diazepam (VALIUM) 5 MG tablet Take 1 tablet (5 mg total) by mouth every 8 (eight) hours as needed for muscle spasms. Patient not taking: Reported on 10/14/2017 09/13/15   Malen Gauze, MD  HYDROcodone-acetaminophen (NORCO/VICODIN) 5-325 MG tablet Take 1-2 tablets by mouth every 6 (six) hours as needed for moderate pain. 10/14/17   Vanetta Mulders, MD  oxyCODONE-acetaminophen (ROXICET) 5-325 MG per tablet Take 1 tablet by mouth every 4 (four) hours as needed for severe pain. Patient not taking: Reported on 10/14/2017 09/13/15   Malen Gauze, MD  sulfamethoxazole-trimethoprim (BACTRIM DS,SEPTRA DS) 800-160 MG per tablet Take 1 tablet by mouth 2 (two) times daily. Patient not taking: Reported on 10/14/2017 09/13/15   Malen Gauze, MD    Family History No family history on file.  Social History Social History  Substance Use Topics  . Smoking status: Never Smoker  . Smokeless tobacco: Never Used  . Alcohol use Yes     Comment: rare     Allergies   Patient has no known allergies.   Review of Systems Review of  Systems  Constitutional: Negative for fever.  HENT: Negative for congestion.   Eyes: Negative for visual disturbance.  Respiratory: Negative for shortness of breath.   Cardiovascular: Negative for leg swelling.  Gastrointestinal: Positive for nausea. Negative for abdominal pain and vomiting.  Genitourinary: Positive for flank pain.  Musculoskeletal: Negative for back pain.  Skin: Negative for rash.  Neurological: Negative for headaches.  Hematological: Does not bruise/bleed easily.  Psychiatric/Behavioral: Negative  for confusion.     Physical Exam Updated Vital Signs BP (!) 134/99 (BP Location: Left Arm)   Pulse 94   Temp 98.7 F (37.1 C) (Oral)   Resp 14   LMP 10/10/2017 (Exact Date)   SpO2 100%   Physical Exam  Constitutional: She is oriented to person, place, and time. She appears well-developed and well-nourished. No distress.  HENT:  Head: Normocephalic and atraumatic.  Mouth/Throat: Oropharynx is clear and moist.  Eyes: Pupils are equal, round, and reactive to light. Conjunctivae and EOM are normal.  Neck: Normal range of motion. Neck supple.  Cardiovascular: Normal rate, regular rhythm and normal heart sounds.   Pulmonary/Chest: Effort normal and breath sounds normal. No respiratory distress.  Abdominal: Soft. Bowel sounds are normal. There is no tenderness.  Musculoskeletal: Normal range of motion.  Neurological: She is alert and oriented to person, place, and time. No cranial nerve deficit or sensory deficit. She exhibits normal muscle tone. Coordination normal.  Skin: Skin is warm.  Nursing note and vitals reviewed.    ED Treatments / Results  Labs (all labs ordered are listed, but only abnormal results are displayed) Labs Reviewed  URINALYSIS, ROUTINE W REFLEX MICROSCOPIC - Abnormal; Notable for the following:       Result Value   Ketones, ur 20 (*)    All other components within normal limits  BASIC METABOLIC PANEL - Abnormal; Notable for the following:    Glucose, Bld 100 (*)    All other components within normal limits  CBC - Abnormal; Notable for the following:    WBC 11.1 (*)    Hemoglobin 10.9 (*)    HCT 35.9 (*)    MCH 23.9 (*)    RDW 17.1 (*)    All other components within normal limits  PREGNANCY, URINE    EKG  EKG Interpretation None       Radiology Ct Renal Stone Study  Result Date: 10/14/2017 CLINICAL DATA:  Flank pain left-sided EXAM: CT ABDOMEN AND PELVIS WITHOUT CONTRAST TECHNIQUE: Multidetector CT imaging of the abdomen and pelvis was  performed following the standard protocol without IV contrast. COMPARISON:  05/30/2015 FINDINGS: Lower chest: Lung bases demonstrate no acute consolidation or pleural effusion. Normal heart size. Hepatobiliary: No focal liver abnormality is seen. No gallstones, gallbladder wall thickening, or biliary dilatation. Pancreas: Unremarkable. No pancreatic ductal dilatation or surrounding inflammatory changes. Spleen: Normal in size without focal abnormality. Adrenals/Urinary Tract: Adrenal glands are within normal limits. There are multiple intrarenal stones on the right, measuring up to 3 mm. 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 Prominent right renal pelvis an ureter but no definitive stones identified along the course of left ureter. A calcification along the left posterior aspect of the bladder is felt to represent phleboliths. There are multiple additional phleboliths in the pelvis. The bladder is otherwise normal Stomach/Bowel: Stomach is within normal limits. Appendix appears normal. No evidence of bowel wall thickening, distention, or inflammatory changes. Sigmoid colon diverticular disease without acute  inflammation Vascular/Lymphatic: No significant vascular findings are present. No enlarged abdominal or pelvic lymph nodes. Reproductive: Uterus and bilateral adnexa are unremarkable. Other: No free air or free fluid.  Small fat in the umbilicus Musculoskeletal: No acute or significant osseous findings. IMPRESSION: 1. There are bilateral intrarenal calculi but no definite ureteral stones are visible. 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 2. Sigmoid colon diverticular disease without acute inflammation. Electronically Signed   By: Jasmine Pang M.D.   On: 10/14/2017 22:31    Procedures Procedures (including critical care time)  Medications Ordered in ED Medications  0.9 %  sodium  chloride infusion ( Intravenous New Bag/Given 10/14/17 2246)  sodium chloride 0.9 % bolus 500 mL (500 mLs Intravenous New Bag/Given 10/14/17 2245)  ondansetron (ZOFRAN) injection 4 mg (4 mg Intravenous Given 10/14/17 2246)  HYDROmorphone (DILAUDID) injection 1 mg (1 mg Intravenous Given 10/14/17 2246)     Initial Impression / Assessment and Plan / ED Course  I have reviewed the triage vital signs and the nursing notes.  Pertinent labs & imaging results that were available during my care of the patient were reviewed by me and considered in my medical decision making (see chart for details).     CT scan shows no evidence of ureteral stone.  Does show some evidence of stones up in the kidneys.  Urinalysis negative for urinary tract infection.  Mild leukocytosis.  No significant renal abnormalities.  We will treat symptomatically and have her follow-up with [facility] where she is currently followed.  In the past she was followed by [facility] urology which no longer followed by them.  Patient states that they take her kidney stones even when deforming the kidneys.  But review of records from [facility] urology showed the only removed stones when they were in the ureter.  I think she may be confused on this.    Final Clinical Impressions(s) / ED Diagnoses   Final diagnoses:  Nephrolithiasis    New Prescriptions New Prescriptions   HYDROCODONE-ACETAMINOPHEN (NORCO/VICODIN) 5-325 MG TABLET    Take 1-2 tablets by mouth every 6 (six) hours as needed for moderate pain.     Vanetta Mulders, MD 10/14/17 [phone]

## 2020-09-18 ENCOUNTER — Emergency Department (HOSPITAL_BASED_OUTPATIENT_CLINIC_OR_DEPARTMENT_OTHER): Payer: Self-pay

## 2020-09-18 ENCOUNTER — Other Ambulatory Visit: Payer: Self-pay

## 2020-09-18 ENCOUNTER — Emergency Department (HOSPITAL_BASED_OUTPATIENT_CLINIC_OR_DEPARTMENT_OTHER)
Admission: EM | Admit: 2020-09-18 | Discharge: 2020-09-19 | Disposition: A | Discharge status: Home / Self Care | Payer: Self-pay | Attending: Emergency Medicine | Admitting: Emergency Medicine

## 2020-09-18 ENCOUNTER — Encounter (HOSPITAL_BASED_OUTPATIENT_CLINIC_OR_DEPARTMENT_OTHER): Payer: Self-pay | Admitting: Emergency Medicine

## 2020-09-18 DIAGNOSIS — R109 Unspecified abdominal pain: Secondary | ICD-10-CM | POA: Insufficient documentation

## 2020-09-18 DIAGNOSIS — N201 Calculus of ureter: Secondary | ICD-10-CM

## 2020-09-18 LAB — URINALYSIS, ROUTINE W REFLEX MICROSCOPIC
Bilirubin Urine: NEGATIVE
Glucose, UA: NEGATIVE mg/dL
Ketones, ur: NEGATIVE mg/dL
Nitrite: NEGATIVE
Protein, ur: 30 mg/dL — AB
Specific Gravity, Urine: >1.03 — ABNORMAL HIGH (ref 1.005–1.030)
pH: 5.5 (ref 5.0–8.0)

## 2020-09-18 LAB — CBC
HCT: 39.8 % (ref 36.0–46.0)
Hemoglobin: 12.1 g/dL (ref 12.0–15.0)
MCH: 24.7 pg — ABNORMAL LOW (ref 26.0–34.0)
MCHC: 30.4 g/dL (ref 30.0–36.0)
MCV: 81.2 fL (ref 80.0–100.0)
Platelets: 366 10*3/uL (ref 150–400)
RBC: 4.9 MIL/uL (ref 3.87–5.11)
RDW: 16.2 % — ABNORMAL HIGH (ref 11.5–15.5)
WBC: 13.2 10*3/uL — ABNORMAL HIGH (ref 4.0–10.5)
nRBC: 0 % (ref 0.0–0.2)

## 2020-09-18 LAB — BASIC METABOLIC PANEL
Anion gap: 11 (ref 5–15)
BUN: 11 mg/dL (ref 6–20)
CO2: 23 mmol/L (ref 22–32)
Calcium: 10.4 mg/dL — ABNORMAL HIGH (ref 8.9–10.3)
Chloride: 102 mmol/L (ref 98–111)
Creatinine, Ser: 0.94 mg/dL (ref 0.44–1.00)
GFR calc Af Amer: >60 mL/min (ref >60)
GFR calc non Af Amer: >60 mL/min (ref >60)
Glucose, Bld: 138 mg/dL — ABNORMAL HIGH (ref 70–99)
Potassium: 4.2 mmol/L (ref 3.5–5.1)
Sodium: 136 mmol/L (ref 135–145)

## 2020-09-18 LAB — URINALYSIS, MICROSCOPIC (REFLEX): RBC / HPF: >50 RBC/hpf (ref 0–5)

## 2020-09-18 LAB — PREGNANCY, URINE: Preg Test, Ur: NEGATIVE

## 2020-09-18 MED ORDER — SODIUM CHLORIDE 0.9 % IV BOLUS
500.0000 mL | Freq: Once | INTRAVENOUS | Status: AC
  Administered 2020-09-18: 500 mL via INTRAVENOUS

## 2020-09-18 MED ORDER — HYDROMORPHONE HCL 1 MG/ML IJ SOLN
1.0000 mg | Freq: Once | INTRAMUSCULAR | Status: AC
  Administered 2020-09-18: 1 mg via INTRAVENOUS
  Filled 2020-09-18: qty 1

## 2020-09-18 MED ORDER — KETOROLAC TROMETHAMINE 15 MG/ML IJ SOLN
15.0000 mg | Freq: Once | INTRAMUSCULAR | Status: AC
  Administered 2020-09-18: 15 mg via INTRAVENOUS
  Filled 2020-09-18: qty 1

## 2020-09-18 MED ORDER — IBUPROFEN 600 MG PO TABS: 600.0000 mg | ORAL_TABLET | Freq: Four times a day (QID) | ORAL | 0 refills | Status: DC | PRN

## 2020-09-18 MED ORDER — OXYCODONE-ACETAMINOPHEN 5-325 MG PO TABS: 1.0000 | ORAL_TABLET | Freq: Four times a day (QID) | ORAL | 0 refills | Status: DC | PRN

## 2020-09-18 NOTE — ED Triage Notes (Signed)
Pt reports left sided abd pain that she attributes to possible kidney stones. Also vomiting, states she cannot keep anything down. Clammy in triage.

## 2020-09-18 NOTE — ED Provider Notes (Signed)
[facility] EMERGENCY DEPARTMENT Provider Note   CSN: [id number] Arrival date & time: 09/18/20  2127     History Chief Complaint  Patient presents with  . Flank Pain    Veronica Cunningham is a 34 y.o. female with a history of kidney stones present emergency department with left flank pain.  She reports abrupt onset of her symptoms this afternoon.  She describes it is identical to her past kidney stones.  He is a sharp pain that radiates around towards her groin.  She said difficulty urinating since the onset of this pain.  She denies fevers or chills.  She has the pain is currently a 10 out of 10.  She says in the past she has needed lithotripsy or intervention to remove her kidney stones.  She reports no known drug allergies.  She did receive both Covid vaccines.  HPI     Past Medical History:  Diagnosis Date  . History of kidney stones   . Renal calculi    bilateral    There are no problems to display for this patient.   Past Surgical History:  Procedure Laterality Date  . CYSTOSCOPY WITH RETROGRADE PYELOGRAM, URETEROSCOPY AND STENT PLACEMENT Right 09/13/2015   Procedure: CYSTOSCOPY WITH RETROGRADE PYELOGRAM, URETEROSCOPY,STONE EXTRACTION AND STENT PLACEMENT;  Surgeon: Malen Gauze, MD;  Location: [facility];  Service: Urology;  Laterality: Right;  . CYSTOSCOPY WITH URETEROSCOPY AND STENT PLACEMENT Right 06/07/2015   Procedure: CYSTOSCOPY WITH RETROGRADE PYLEGRAM, URETEROSCOPY, STONE EXTRACTION, AND STENT PLACEMENT;  Surgeon: Malen Gauze, MD;  Location: [facility];  Service: Urology;  Laterality: Right;  . HOLMIUM LASER APPLICATION Right 06/07/2015   Procedure: HOLMIUM LASER APPLICATION;  Surgeon: Malen Gauze, MD;  Location: [facility];  Service: Urology;  Laterality: Right;     OB History   No obstetric history on file.     History reviewed. No pertinent family history.  Social History    Tobacco Use  . Smoking status: Never Smoker  . Smokeless tobacco: Never Used  Substance Use Topics  . Alcohol use: Yes    Comment: rare  . Drug use: No    Home Medications Prior to Admission medications   Medication Sig Start Date End Date Taking? Authorizing Provider  cetirizine (ZYRTEC) 10 MG tablet Take 10 mg by mouth daily.    [provider]  diazepam (VALIUM) 5 MG tablet Take 1 tablet (5 mg total) by mouth every 8 (eight) hours as needed for muscle spasms. Patient not taking: Reported on 10/14/2017 09/13/15   Malen Gauze, MD  HYDROcodone-acetaminophen (NORCO/VICODIN) 5-325 MG tablet Take 1-2 tablets by mouth every 6 (six) hours as needed for moderate pain. 10/14/17   Vanetta Mulders, MD  ibuprofen (ADVIL) 600 MG tablet Take 1 tablet (600 mg total) by mouth every 6 (six) hours as needed for up to 30 doses for mild pain or moderate pain. 09/18/20   Terald Sleeper, MD  ibuprofen (ADVIL,MOTRIN) 200 MG tablet Take 200 mg by mouth every 6 (six) hours as needed.    [provider]  oxyCODONE-acetaminophen (PERCOCET/ROXICET) 5-325 MG tablet Take 1 tablet by mouth every 6 (six) hours as needed for up to 10 doses for severe pain. 09/18/20   Terald Sleeper, MD  oxyCODONE-acetaminophen (ROXICET) 5-325 MG per tablet Take 1 tablet by mouth every 4 (four) hours as needed for severe pain. Patient not taking: Reported on 10/14/2017 09/13/15   Malen Gauze,  MD  sulfamethoxazole-trimethoprim (BACTRIM DS,SEPTRA DS) 800-160 MG per tablet Take 1 tablet by mouth 2 (two) times daily. Patient not taking: Reported on 10/14/2017 09/13/15   Malen Gauze, MD    Allergies    Patient has no known allergies.  Review of Systems   Review of Systems  Constitutional: Negative for chills and fever.  HENT: Positive for congestion. Negative for voice change.   Eyes: Negative for pain and visual disturbance.  Respiratory: Negative for cough and shortness of breath.    Cardiovascular: Negative for chest pain and palpitations.  Gastrointestinal: Negative for abdominal pain and vomiting.  Genitourinary: Positive for difficulty urinating, dysuria and flank pain. Negative for hematuria.  Musculoskeletal: Negative for back pain and joint swelling.  Skin: Negative for color change and rash.  Neurological: Negative for syncope and light-headedness.  Psychiatric/Behavioral: Negative for agitation and confusion.  All other systems reviewed and are negative.   Physical Exam Updated Vital Signs BP (!) 151/93 (BP Location: Right Wrist)   Pulse (!) 113   Temp 98.1 F (36.7 C) (Oral)   Resp 20   Wt 115.6 kg   LMP 09/18/2020   SpO2 98%   BMI 46.60 kg/m   Physical Exam Vitals and nursing note reviewed.  Constitutional:      General: She is not in acute distress.    Appearance: She is well-developed.  HENT:     Head: Normocephalic and atraumatic.  Eyes:     Conjunctiva/sclera: Conjunctivae normal.  Cardiovascular:     Rate and Rhythm: Normal rate and regular rhythm.     Pulses: Normal pulses.     Heart sounds: No murmur heard.   Pulmonary:     Effort: Pulmonary effort is normal. No respiratory distress.  Abdominal:     General: There is no distension.     Palpations: Abdomen is soft.     Tenderness: There is no abdominal tenderness. There is no right CVA tenderness, left CVA tenderness or guarding. Negative signs include Murphy's sign.  Musculoskeletal:     Cervical back: Neck supple.  Skin:    General: Skin is warm and dry.  Neurological:     General: No focal deficit present.     Mental Status: She is alert and oriented to person, place, and time.     ED Results / Procedures / Treatments   Labs (all labs ordered are listed, but only abnormal results are displayed) Labs Reviewed  BASIC METABOLIC PANEL - Abnormal; Notable for the following components:      Result Value   Glucose, Bld 138 (*)    Calcium 10.4 (*)    All other components  within normal limits  CBC - Abnormal; Notable for the following components:   WBC 13.2 (*)    MCH 24.7 (*)    RDW 16.2 (*)    All other components within normal limits  PREGNANCY, URINE  URINALYSIS, ROUTINE W REFLEX MICROSCOPIC    EKG None  Radiology No results found.  Procedures Procedures (including critical care time)  Medications Ordered in ED Medications  HYDROmorphone (DILAUDID) injection 1 mg (1 mg Intravenous Given 09/18/20 2234)  sodium chloride 0.9 % bolus 500 mL (500 mLs Intravenous New Bag/Given 09/18/20 2233)  ketorolac (TORADOL) 15 MG/ML injection 15 mg (15 mg Intravenous Given 09/18/20 2233)    ED Course  I have reviewed the triage vital signs and the nursing notes.  Pertinent labs & imaging results that were available during my care of the patient were  reviewed by me and considered in my medical decision making (see chart for details).  This is a 34-year female presented emerge department with acute onset of left-sided flank pain, which feels very similar to his prior kidney stones.  Suspect this is likely a recurrent kidney stone.  She describes some difficulty with urination.  No infectious symptoms.  Will need to check a UA.  I also obtain basic labs which show WBC 13.2, BMP with normal Cr at 0.94.  Fairly low suspicion for acute pyelonephritis with no flank tenderness, no other infectious fever symptoms.  I gave her IV fluids, IV Toradol and IV Dilaudid for her pain.  She will need a renal stone study given her complicated stones in the past.  She was not able to see a urologist in the past due to lack of health insurance.    Clinical Course as of Sep 19 2331  Sat Sep 18, 2020  2326 Signed out to Dr Ellwood Handler EDP with plan to f/u on CT Renal study, dispo per findings.   [MT]    Clinical Course User Index [MT] Terald Sleeper, MD    Clinical Course as of Sep 19 2331  Sat Sep 18, 2020  2326 Signed out to Dr Ellwood Handler EDP with plan to f/u on CT Renal study,  dispo per findings.   [MT]    Clinical Course User Index [MT] Raneem Mendolia, Kermit Balo, MD    Final Clinical Impression(s) / ED Diagnoses Final diagnoses:  None    Rx / DC Orders ED Discharge Orders         Ordered    oxyCODONE-acetaminophen (PERCOCET/ROXICET) 5-325 MG tablet  Every 6 hours PRN        09/18/20 2332    ibuprofen (ADVIL) 600 MG tablet  Every 6 hours PRN        09/18/20 2332           Terald Sleeper, MD 09/18/20 2332

## 2020-09-19 MED ORDER — ONDANSETRON 4 MG PO TBDP
8.0000 mg | ORAL_TABLET | Freq: Once | ORAL | Status: AC
  Administered 2020-09-19: 8 mg via ORAL
  Filled 2020-09-19: qty 2

## 2020-09-19 MED ORDER — OXYCODONE-ACETAMINOPHEN 5-325 MG PO TABS: 1.0000 | ORAL_TABLET | Freq: Four times a day (QID) | ORAL | 0 refills | Status: DC | PRN

## 2020-09-19 MED ORDER — IBUPROFEN 600 MG PO TABS: 600.0000 mg | ORAL_TABLET | Freq: Four times a day (QID) | ORAL | 0 refills | Status: DC | PRN

## 2020-09-19 NOTE — ED Provider Notes (Signed)
Nursing notes and vitals signs, including pulse oximetry, reviewed.  Summary of this visit's results, reviewed by myself:  EKG:  EKG Interpretation  Date/Time:    Ventricular Rate:    PR Interval:    QRS Duration:   QT Interval:    QTC Calculation:   R Axis:     Text Interpretation:         Labs:  Results for orders placed or performed during the hospital encounter of 09/18/20 (from the past 24 hour(s))  Basic metabolic panel     Status: Abnormal   Collection Time: 09/18/20  9:35 PM  Result Value Ref Range   Sodium 136 135 - 145 mmol/L   Potassium 4.2 3.5 - 5.1 mmol/L   Chloride 102 98 - 111 mmol/L   CO2 23 22 - 32 mmol/L   Glucose, Bld 138 (H) 70 - 99 mg/dL   BUN 11 6 - 20 mg/dL   Creatinine, Ser 9.83 0.44 - 1.00 mg/dL   Calcium 38.2 (H) 8.9 - 10.3 mg/dL   GFR calc non Af Amer >60 >60 mL/min   GFR calc Af Amer >60 >60 mL/min   Anion gap 11 5 - 15  CBC     Status: Abnormal   Collection Time: 09/18/20  9:35 PM  Result Value Ref Range   WBC 13.2 (H) 4.0 - 10.5 K/uL   RBC 4.90 3.87 - 5.11 MIL/uL   Hemoglobin 12.1 12.0 - 15.0 g/dL   HCT 50.5 36 - 46 %   MCV 81.2 80.0 - 100.0 fL   MCH 24.7 (L) 26.0 - 34.0 pg   MCHC 30.4 30.0 - 36.0 g/dL   RDW 39.7 (H) 67.3 - 41.9 %   Platelets 366 150 - 400 K/uL   nRBC 0.0 0.0 - 0.2 %  Urinalysis, Routine w reflex microscopic     Status: Abnormal   Collection Time: 09/18/20 11:18 PM  Result Value Ref Range   Color, Urine YELLOW YELLOW   APPearance CLOUDY (A) CLEAR   Specific Gravity, Urine >1.030 (H) 1.005 - 1.030   pH 5.5 5.0 - 8.0   Glucose, UA NEGATIVE NEGATIVE mg/dL   Hgb urine dipstick LARGE (A) NEGATIVE   Bilirubin Urine NEGATIVE NEGATIVE   Ketones, ur NEGATIVE NEGATIVE mg/dL   Protein, ur 30 (A) NEGATIVE mg/dL   Nitrite NEGATIVE NEGATIVE   Leukocytes,Ua TRACE (A) NEGATIVE  Pregnancy, urine     Status: None   Collection Time: 09/18/20 11:18 PM  Result Value Ref Range   Preg Test, Ur NEGATIVE NEGATIVE  Urinalysis,  Microscopic (reflex)     Status: Abnormal   Collection Time: 09/18/20 11:18 PM  Result Value Ref Range   RBC / HPF >50 0 - 5 RBC/hpf   WBC, UA 6-10 0 - 5 WBC/hpf   Bacteria, UA MANY (A) NONE SEEN   Squamous Epithelial / LPF 0-5 0 - 5   Ca Oxalate Crys, UA PRESENT     Imaging Studies: CT Renal Stone Study  Result Date: 09/18/2020 CLINICAL DATA:  Left-sided pain EXAM: CT ABDOMEN AND PELVIS WITHOUT CONTRAST TECHNIQUE: Multidetector CT imaging of the abdomen and pelvis was performed following the standard protocol without IV contrast. COMPARISON:  10/14/2017 FINDINGS: Lower chest: The lung bases are clear. The heart size is normal. Hepatobiliary: The dome of the liver is not fully visualized on this study. Normal gallbladder.There is no biliary ductal dilation. Pancreas: Normal contours without ductal dilatation. No peripancreatic fluid collection. Spleen: Unremarkable. Adrenals/Urinary Tract: --Adrenal glands: Unremarkable. --  Right kidney/ureter: There are multiple nonobstructing stones in the right kidney measuring up to approximately 5 mm. 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 There is moderate left-sided hydroureteronephrosis secondary to an obstructing 7 mm stone in the distal left ureter, nearly at the left UVJ. Additional smaller nonobstructing stones are noted in the left kidney. --Urinary bladder: The urinary bladder is decompressed and therefore is poorly evaluated. Stomach/Bowel: --Stomach/Duodenum: No hiatal hernia or other gastric abnormality. Normal duodenal course and caliber. --Small bowel: Unremarkable. --Colon: There is a large amount of stool at the level of the rectum. There are scattered colonic diverticula without CT evidence for diverticulitis. --Appendix: Normal. Vascular/Lymphatic: Normal course and caliber of the major abdominal vessels. --No retroperitoneal lymphadenopathy. --No mesenteric lymphadenopathy. --No pelvic or inguinal  lymphadenopathy. Reproductive: Unremarkable Other: No ascites or free air. The abdominal wall is normal. Musculoskeletal. No acute displaced fractures. IMPRESSION: 1. Moderate left-sided hydroureteronephrosis secondary to an obstructing 7 mm stone in the distal left ureter, nearly at the left UVJ. 2. 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 3. Bilateral nonobstructing nephrolithiasis. 4. Large amount of stool at the level of the rectum. Electronically Signed   By: Katherine Mantle M.D.   On: 09/18/2020 23:58   12:20 AM Patient advised of CT findings.  She was advised that the stone will likely not pass on its own and will require urologic intervention.    Allen Egerton, Jonny Ruiz, MD 09/19/20 [phone]

## 2020-12-24 ENCOUNTER — Ambulatory Visit: Payer: Self-pay | Attending: Internal Medicine

## 2020-12-24 DIAGNOSIS — Z23 Encounter for immunization: Secondary | ICD-10-CM

## 2020-12-24 NOTE — Progress Notes (Signed)
   Covid-19 Vaccination Clinic  Name:  Veronica Cunningham    MRN: [id number] DOB: [birth year]  12/24/2020  [name] was observed post Covid-19 immunization for 15 minutes without incident. She was provided with Vaccine Information Sheet and instruction to access the V-Safe system.   [name] was instructed to call 911 with any severe reactions post vaccine: Marland Kitchen Difficulty breathing  . Swelling of face and throat  . A fast heartbeat  . A bad rash all over body  . Dizziness and weakness   Immunizations Administered    Name Date Dose VIS Date Route   Pfizer COVID-19 Vaccine 12/24/2020  2:32 PM 0.3 mL 10/06/2020 Intramuscular   Manufacturer: [employer], [employer]   Lot: [id number]   NDC: [address]-9

## 2021-01-25 IMAGING — CT CT RENAL STONE PROTOCOL
2 of 4 series · 16 of 46 positions shown, 18 images · non-contrast
Comparison: 10/14/2017

CLINICAL DATA: Left-sided pain

EXAM:
CT ABDOMEN AND PELVIS WITHOUT CONTRAST
TECHNIQUE: Multidetector CT imaging of the abdomen and pelvis was performed
following the standard protocol without IV contrast.

[Series 2: axial st · axial · 0.70mm/px · z∈[+711,+1096]mm · 13 of 85 slices shown, 15 images]
[im 4/85  soft-tissue]
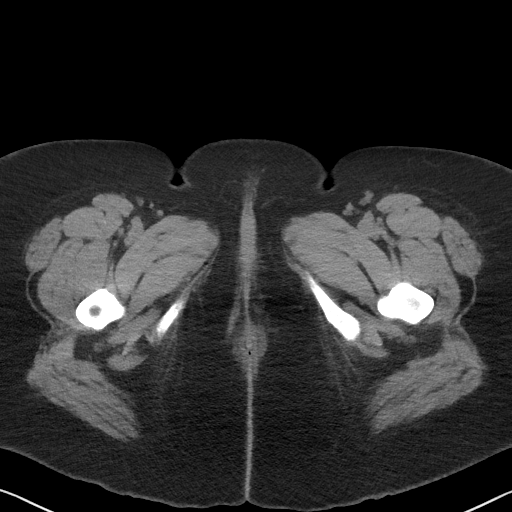
[im 4/85  bone]
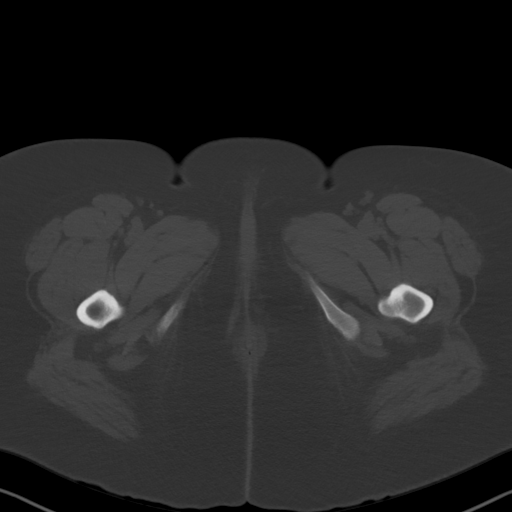
[im 10/85  soft-tissue]
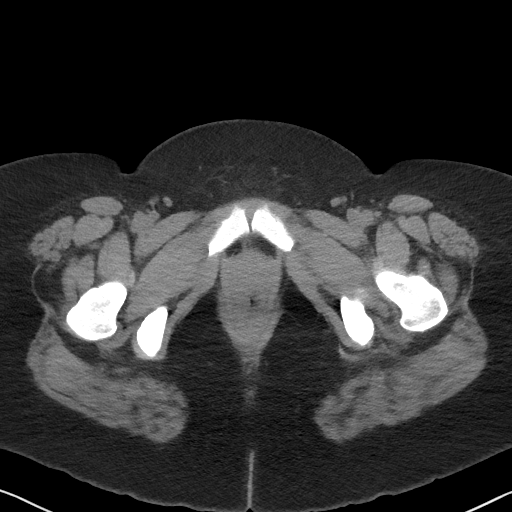
[im 17/85  soft-tissue]
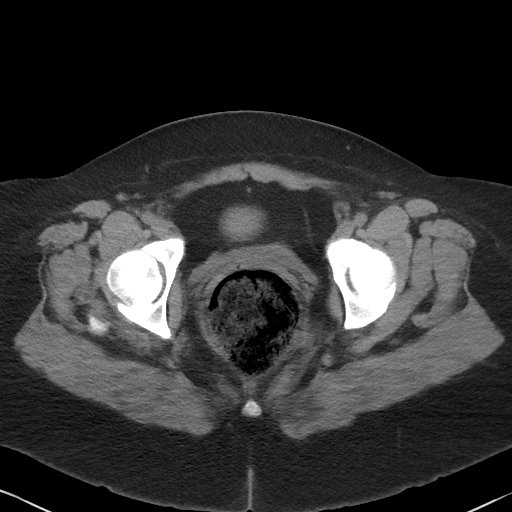
[im 23/85  soft-tissue]
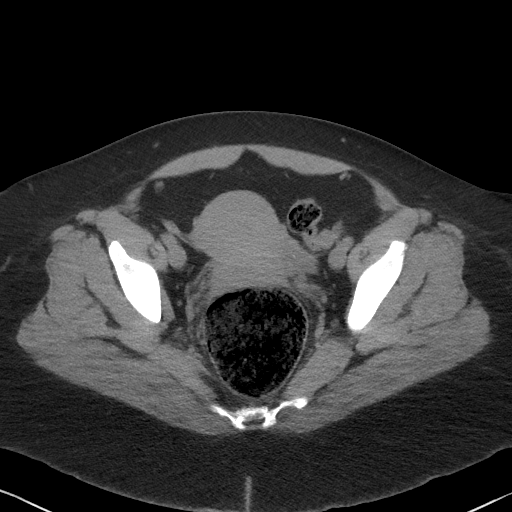
[im 30/85  soft-tissue]
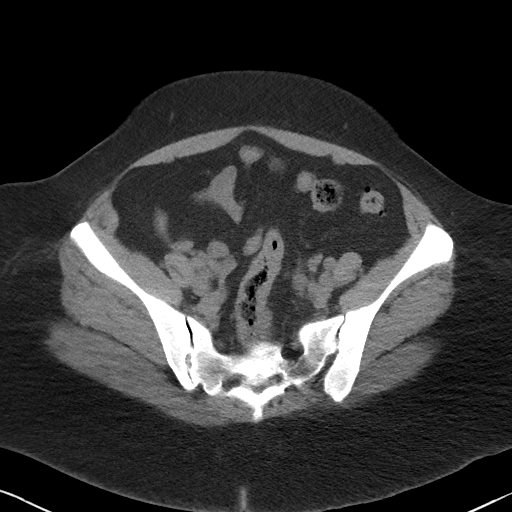
[im 36/85  soft-tissue]
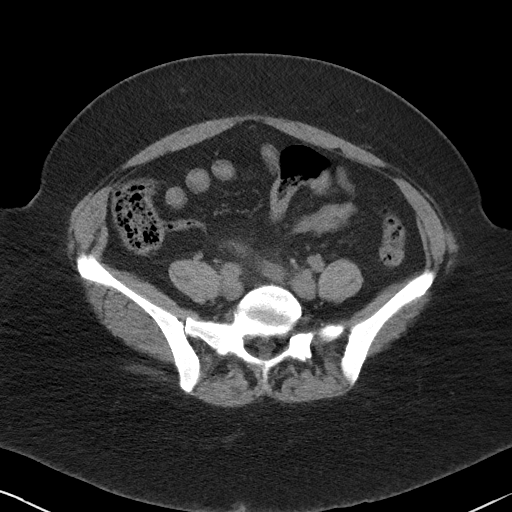
[im 43/85  soft-tissue]
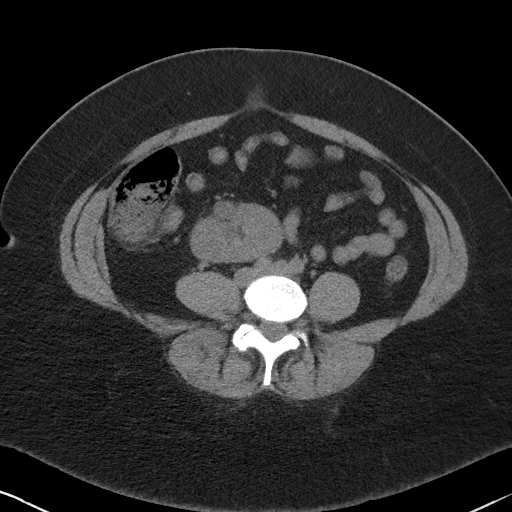
[im 49/85  soft-tissue]
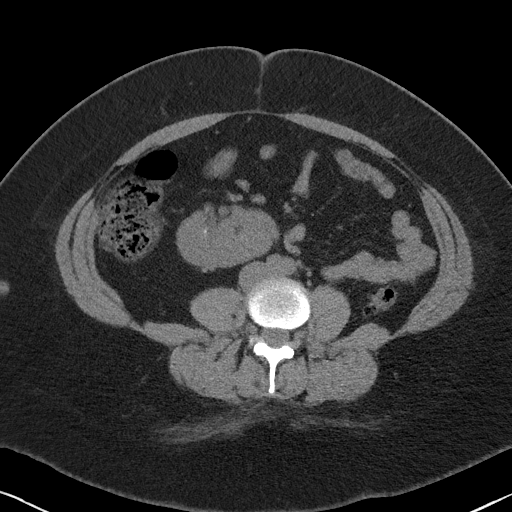
[im 55/85  soft-tissue]
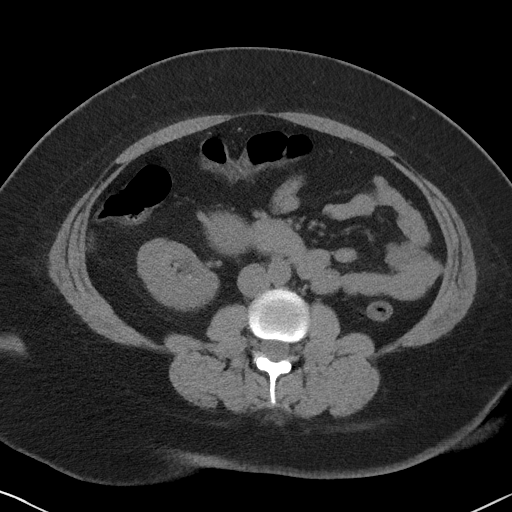
[im 55/85  bone]
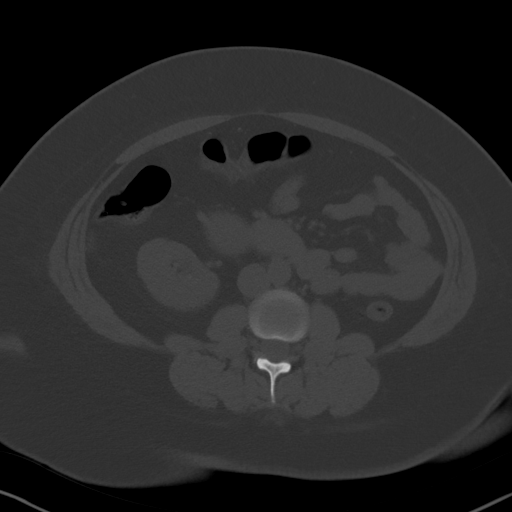
[im 62/85  soft-tissue]
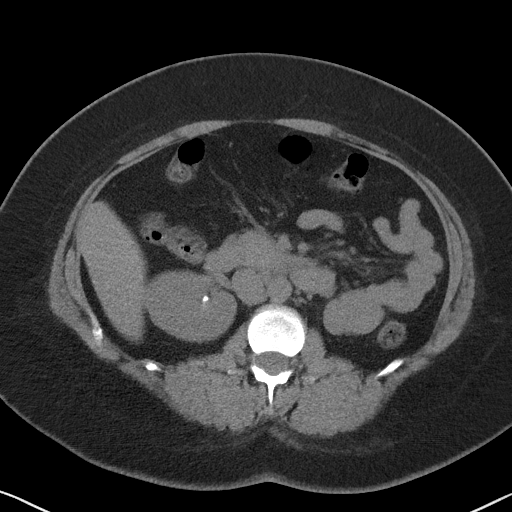
[im 68/85  soft-tissue]
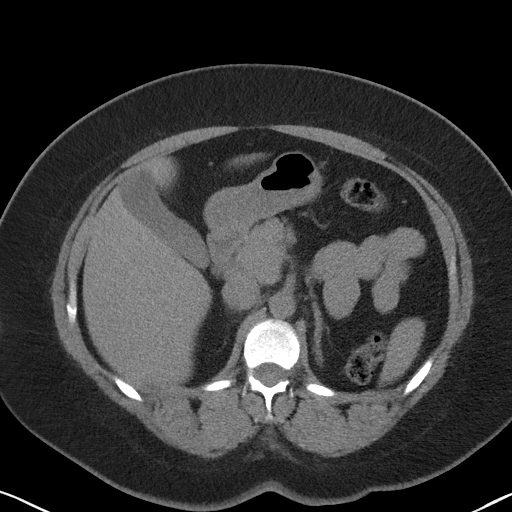
[im 75/85  soft-tissue]
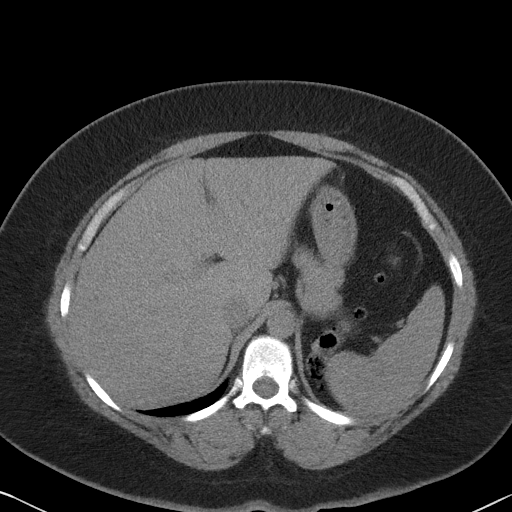
[im 81/85  soft-tissue]
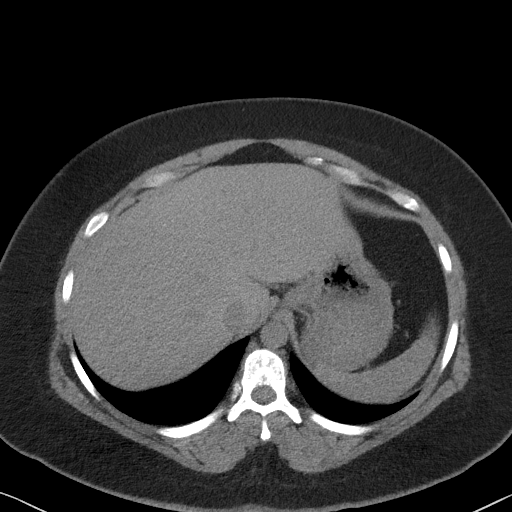

[Series 4: coronal st · coronal · 0.82mm/px · 3 of 94 slices shown]
[im 32/94  soft-tissue]
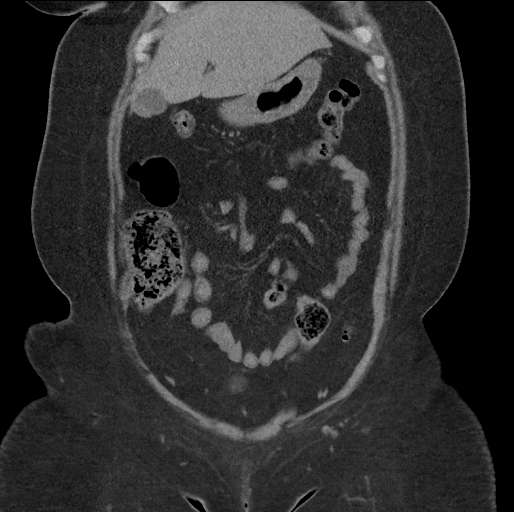
[im 42/94  soft-tissue]
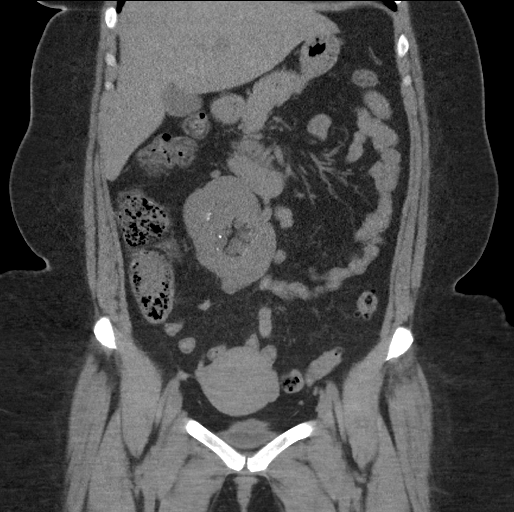
[im 52/94  soft-tissue]
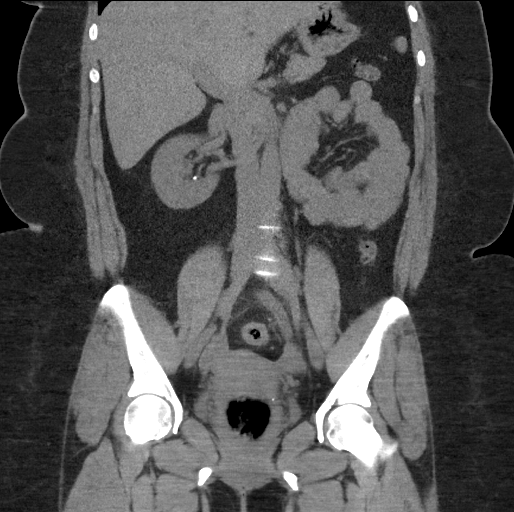

[16 of 46 positions shown; findings below may reference images not displayed]

FINDINGS: Lower chest: The lung bases are clear. The heart size is normal.

Hepatobiliary: The dome of the liver is not fully visualized on this
study. Normal gallbladder.There is no biliary ductal dilation.

Pancreas: Normal contours without ductal dilatation. No
peripancreatic fluid collection.

Spleen: Unremarkable.

Adrenals/Urinary Tract:

--Adrenal glands: Unremarkable.

--Right kidney/ureter: There are multiple nonobstructing stones in
the right kidney measuring up to approximately 5 mm.

--Left kidney/ureter: The left kidney is ectopic and located in the
right lower quadrant. There is moderate left-sided
hydroureteronephrosis secondary to an obstructing 7 mm stone in the
distal left ureter, nearly at the left UVJ. Additional smaller
nonobstructing stones are noted in the left kidney.

--Urinary bladder: The urinary bladder is decompressed and therefore
is poorly evaluated.

Stomach/Bowel:

--Stomach/Duodenum: No hiatal hernia or other gastric abnormality.
Normal duodenal course and caliber.

--Small bowel: Unremarkable.

--Colon: There is a large amount of stool at the level of the
rectum. There are scattered colonic diverticula without CT evidence
for diverticulitis.

--Appendix: Normal.

Vascular/Lymphatic: Normal course and caliber of the major abdominal
vessels.

--No retroperitoneal lymphadenopathy.

--No mesenteric lymphadenopathy.

--No pelvic or inguinal lymphadenopathy.

Reproductive: Unremarkable

Other: No ascites or free air. The abdominal wall is normal.

Musculoskeletal. No acute displaced fractures.
IMPRESSION: 1. Moderate left-sided hydroureteronephrosis secondary to an
obstructing 7 mm stone in the distal left ureter, nearly at the left
UVJ.
2. Ectopic left kidney located in the right lower quadrant.
3. Bilateral nonobstructing nephrolithiasis.
4. Large amount of stool at the level of the rectum.

## 2023-12-26 ENCOUNTER — Encounter (HOSPITAL_BASED_OUTPATIENT_CLINIC_OR_DEPARTMENT_OTHER): Payer: Self-pay | Admitting: Urology

## 2023-12-26 ENCOUNTER — Other Ambulatory Visit: Payer: Self-pay

## 2023-12-26 ENCOUNTER — Emergency Department (HOSPITAL_BASED_OUTPATIENT_CLINIC_OR_DEPARTMENT_OTHER): Payer: Self-pay

## 2023-12-26 ENCOUNTER — Emergency Department (HOSPITAL_BASED_OUTPATIENT_CLINIC_OR_DEPARTMENT_OTHER)
Admission: EM | Admit: 2023-12-26 | Discharge: 2023-12-26 | Disposition: A | Discharge status: Home / Self Care | Payer: Self-pay | Attending: Emergency Medicine | Admitting: Emergency Medicine

## 2023-12-26 DIAGNOSIS — N201 Calculus of ureter: Secondary | ICD-10-CM | POA: Diagnosis not present

## 2023-12-26 DIAGNOSIS — R102 Pelvic and perineal pain: Secondary | ICD-10-CM | POA: Diagnosis present

## 2023-12-26 LAB — URINALYSIS, ROUTINE W REFLEX MICROSCOPIC
Bilirubin Urine: NEGATIVE
Glucose, UA: NEGATIVE mg/dL
Ketones, ur: 15 mg/dL — AB
Nitrite: NEGATIVE
Protein, ur: 100 mg/dL — AB
Specific Gravity, Urine: 1.02 (ref 1.005–1.030)
pH: 7 (ref 5.0–8.0)

## 2023-12-26 LAB — URINALYSIS, MICROSCOPIC (REFLEX)

## 2023-12-26 LAB — CBC
HCT: 35.8 % — ABNORMAL LOW (ref 36.0–46.0)
Hemoglobin: 10.5 g/dL — ABNORMAL LOW (ref 12.0–15.0)
MCH: 22.2 pg — ABNORMAL LOW (ref 26.0–34.0)
MCHC: 29.3 g/dL — ABNORMAL LOW (ref 30.0–36.0)
MCV: 75.5 fL — ABNORMAL LOW (ref 80.0–100.0)
Platelets: 406 10*3/uL — ABNORMAL HIGH (ref 150–400)
RBC: 4.74 MIL/uL (ref 3.87–5.11)
RDW: 17.3 % — ABNORMAL HIGH (ref 11.5–15.5)
WBC: 15.4 10*3/uL — ABNORMAL HIGH (ref 4.0–10.5)
nRBC: 0 % (ref 0.0–0.2)

## 2023-12-26 LAB — BASIC METABOLIC PANEL
Anion gap: 10 (ref 5–15)
BUN: 12 mg/dL (ref 6–20)
CO2: 22 mmol/L (ref 22–32)
Calcium: 9.9 mg/dL (ref 8.9–10.3)
Chloride: 104 mmol/L (ref 98–111)
Creatinine, Ser: 0.87 mg/dL (ref 0.44–1.00)
GFR, Estimated: >60 mL/min (ref >60)
Glucose, Bld: 171 mg/dL — ABNORMAL HIGH (ref 70–99)
Potassium: 3.8 mmol/L (ref 3.5–5.1)
Sodium: 136 mmol/L (ref 135–145)

## 2023-12-26 LAB — PREGNANCY, URINE: Preg Test, Ur: NEGATIVE

## 2023-12-26 MED ORDER — HYDROMORPHONE HCL 1 MG/ML IJ SOLN
1.0000 mg | Freq: Once | INTRAMUSCULAR | Status: AC
  Administered 2023-12-26: 1 mg via INTRAVENOUS
  Filled 2023-12-26: qty 1

## 2023-12-26 MED ORDER — HYDROCODONE-ACETAMINOPHEN 5-325 MG PO TABS: 1.0000 | ORAL_TABLET | Freq: Four times a day (QID) | ORAL | 0 refills | Status: DC | PRN

## 2023-12-26 MED ORDER — ONDANSETRON HCL 4 MG/2ML IJ SOLN
4.0000 mg | Freq: Once | INTRAMUSCULAR | Status: AC
  Administered 2023-12-26: 4 mg via INTRAVENOUS
  Filled 2023-12-26: qty 2

## 2023-12-26 MED ORDER — SODIUM CHLORIDE 0.9 % IV BOLUS
1000.0000 mL | Freq: Once | INTRAVENOUS | Status: AC
  Administered 2023-12-26: 1000 mL via INTRAVENOUS

## 2023-12-26 MED ORDER — TAMSULOSIN HCL 0.4 MG PO CAPS: 0.4000 mg | ORAL_CAPSULE | Freq: Every day | ORAL | 0 refills | Status: DC

## 2023-12-26 MED ORDER — ONDANSETRON 4 MG PO TBDP: 4.0000 mg | ORAL_TABLET | Freq: Three times a day (TID) | ORAL | 0 refills | Status: DC | PRN

## 2023-12-26 MED ORDER — IBUPROFEN 600 MG PO TABS: 600.0000 mg | ORAL_TABLET | Freq: Four times a day (QID) | ORAL | 0 refills | Status: DC | PRN

## 2023-12-26 MED ORDER — KETOROLAC TROMETHAMINE 30 MG/ML IJ SOLN
30.0000 mg | Freq: Once | INTRAMUSCULAR | Status: AC
  Administered 2023-12-26: 30 mg via INTRAVENOUS
  Filled 2023-12-26: qty 1

## 2023-12-26 NOTE — ED Provider Notes (Signed)
 [place] [facility]  Provider Note  CSN: [id number] Arrival date & time: 12/26/23 0010  History Chief Complaint  Patient presents with   Pelvic Pain    Veronica Cunningham is a 38 y.o. female with history of renal stones and a known ectopic left kidney located in RLQ reports pelvic pain similar to prior renal colic. Associated with N/V, no dysuria or fever.    Home Medications Prior to Admission medications   Medication Sig Start Date End Date Taking? Authorizing Provider  HYDROcodone -acetaminophen  (NORCO/VICODIN) 5-325 MG tablet Take 1 tablet by mouth every 6 (six) hours as needed for severe pain (pain score 7-10). 12/26/23  Yes Roselyn Carlin NOVAK, MD  ondansetron  (ZOFRAN -ODT) 4 MG disintegrating tablet Take 1 tablet (4 mg total) by mouth every 8 (eight) hours as needed for nausea or vomiting. 12/26/23  Yes Roselyn Carlin NOVAK, MD  tamsulosin  (FLOMAX ) 0.4 MG CAPS capsule Take 1 capsule (0.4 mg total) by mouth daily. 12/26/23  Yes Roselyn Carlin NOVAK, MD  cetirizine (ZYRTEC) 10 MG tablet Take 10 mg by mouth daily.    [provider]  ibuprofen  (ADVIL ) 600 MG tablet Take 1 tablet (600 mg total) by mouth every 6 (six) hours as needed for up to 30 doses for mild pain (pain score 1-3) or moderate pain (pain score 4-6). 12/26/23   Roselyn Carlin NOVAK, MD     Allergies    Patient has no known allergies.   Review of Systems   Review of Systems Please see HPI for pertinent positives and negatives  Physical Exam BP (!) 171/103   Pulse 98   Temp 98.5 F (36.9 C) (Oral)   Resp 18   Ht 5' 1 (1.549 m)   Wt 120.9 kg   SpO2 98%   BMI 50.37 kg/m   Physical Exam Vitals and nursing note reviewed.  HENT:     Head: Normocephalic.     Nose: Nose normal.  Eyes:     Extraocular Movements: Extraocular movements intact.  Pulmonary:     Effort: Pulmonary effort is normal.  Musculoskeletal:        General: Normal range of motion.     Cervical back:  Neck supple.  Skin:    Findings: No rash (on exposed skin).  Neurological:     Mental Status: She is alert and oriented to person, place, and time.  Psychiatric:        Mood and Affect: Mood normal.     ED Results / Procedures / Treatments   EKG None  Procedures Procedures  Medications Ordered in the ED Medications  HYDROmorphone  (DILAUDID ) injection 1 mg (1 mg Intravenous Given 12/26/23 0211)  ketorolac  (TORADOL ) 30 MG/ML injection 30 mg (30 mg Intravenous Given 12/26/23 0211)  ondansetron  (ZOFRAN ) injection 4 mg (4 mg Intravenous Given 12/26/23 0211)  sodium chloride  0.9 % bolus 1,000 mL (1,000 mLs Intravenous New Bag/Given 12/26/23 0210)    Initial Impression and Plan  Patient here with pelvic pain similar to previous renal stones. Labs done in triage show CBC with mild leukocytosis, BMP is unremarkable. UA with blood but no infection. HCG is neg. I personally viewed the images from radiology studies and agree with radiologist interpretation: CT shows a stone in distal L ureter. Will give pain/nausea meds and IVF and reassess for symptom control.   ED Course   Clinical Course as of 12/26/23 0311  Wed Dec 26, 2023  0308 Patient reports pain is improved and she  is comfortable going home. Plan Rx for Norco, zofran  and Flomax . Urology follow up, RTED for any worsening.  [CS]    Clinical Course User Index [CS] Roselyn Carlin NOVAK, MD     MDM Rules/Calculators/A&P Medical Decision Making Given presenting complaint, I considered that admission might be necessary. After review of results from ED lab and/or imaging studies, admission to the hospital is not indicated at this time.    Problems Addressed: Left ureteral stone: acute illness or injury  Amount and/or Complexity of Data Reviewed Labs: ordered. Decision-making details documented in ED Course. Radiology: ordered and independent interpretation performed. Decision-making details documented in ED Course.  Risk Prescription  drug management. Parenteral controlled substances. Decision regarding hospitalization.     Final Clinical Impression(s) / ED Diagnoses Final diagnoses:  Left ureteral stone    Rx / DC Orders ED Discharge Orders          Ordered    ibuprofen  (ADVIL ) 600 MG tablet  Every 6 hours PRN        12/26/23 0310    HYDROcodone -acetaminophen  (NORCO/VICODIN) 5-325 MG tablet  Every 6 hours PRN        12/26/23 0310    tamsulosin  (FLOMAX ) 0.4 MG CAPS capsule  Daily        12/26/23 0310    ondansetron  (ZOFRAN -ODT) 4 MG disintegrating tablet  Every 8 hours PRN        12/26/23 0310             Roselyn Carlin NOVAK, MD 12/26/23 [phone]

## 2023-12-26 NOTE — ED Triage Notes (Signed)
 Pt states concern for kidney stone  States left side pelvic pain and N/V that started today at 1700  Feels same as other kidney stones in past

## 2023-12-27 ENCOUNTER — Telehealth (HOSPITAL_BASED_OUTPATIENT_CLINIC_OR_DEPARTMENT_OTHER): Payer: Self-pay | Admitting: Emergency Medicine

## 2023-12-27 MED ORDER — TAMSULOSIN HCL 0.4 MG PO CAPS: 0.4000 mg | ORAL_CAPSULE | Freq: Every day | ORAL | 0 refills | Status: AC

## 2023-12-27 NOTE — Telephone Encounter (Signed)
 Sent flomax to [facility]

## 2023-12-30 ENCOUNTER — Emergency Department (HOSPITAL_BASED_OUTPATIENT_CLINIC_OR_DEPARTMENT_OTHER): Payer: BC Managed Care – PPO

## 2023-12-30 ENCOUNTER — Other Ambulatory Visit: Payer: Self-pay

## 2023-12-30 ENCOUNTER — Encounter (HOSPITAL_BASED_OUTPATIENT_CLINIC_OR_DEPARTMENT_OTHER): Payer: Self-pay | Admitting: Emergency Medicine

## 2023-12-30 ENCOUNTER — Emergency Department (HOSPITAL_BASED_OUTPATIENT_CLINIC_OR_DEPARTMENT_OTHER)
Admission: EM | Admit: 2023-12-30 | Discharge: 2023-12-30 | Disposition: A | Discharge status: Home / Self Care | Payer: BC Managed Care – PPO | Attending: Student | Admitting: Student

## 2023-12-30 DIAGNOSIS — R109 Unspecified abdominal pain: Secondary | ICD-10-CM | POA: Diagnosis present

## 2023-12-30 DIAGNOSIS — N132 Hydronephrosis with renal and ureteral calculous obstruction: Secondary | ICD-10-CM | POA: Diagnosis not present

## 2023-12-30 DIAGNOSIS — Z20822 Contact with and (suspected) exposure to covid-19: Secondary | ICD-10-CM | POA: Diagnosis not present

## 2023-12-30 DIAGNOSIS — N200 Calculus of kidney: Secondary | ICD-10-CM

## 2023-12-30 LAB — CBC WITH DIFFERENTIAL/PLATELET
Abs Immature Granulocytes: 0.05 10*3/uL (ref 0.00–0.07)
Basophils Absolute: 0.1 10*3/uL (ref 0.0–0.1)
Basophils Relative: 1 %
Eosinophils Absolute: 0.1 10*3/uL (ref 0.0–0.5)
Eosinophils Relative: 1 %
HCT: 33.8 % — ABNORMAL LOW (ref 36.0–46.0)
Hemoglobin: 9.9 g/dL — ABNORMAL LOW (ref 12.0–15.0)
Immature Granulocytes: 1 %
Lymphocytes Relative: 19 %
Lymphs Abs: 2 10*3/uL (ref 0.7–4.0)
MCH: 22.1 pg — ABNORMAL LOW (ref 26.0–34.0)
MCHC: 29.3 g/dL — ABNORMAL LOW (ref 30.0–36.0)
MCV: 75.6 fL — ABNORMAL LOW (ref 80.0–100.0)
Monocytes Absolute: 0.5 10*3/uL (ref 0.1–1.0)
Monocytes Relative: 5 %
Neutro Abs: 7.8 10*3/uL — ABNORMAL HIGH (ref 1.7–7.7)
Neutrophils Relative %: 73 %
Platelets: 326 10*3/uL (ref 150–400)
RBC: 4.47 MIL/uL (ref 3.87–5.11)
RDW: 17.2 % — ABNORMAL HIGH (ref 11.5–15.5)
WBC: 10.5 10*3/uL (ref 4.0–10.5)
nRBC: 0 % (ref 0.0–0.2)

## 2023-12-30 LAB — URINALYSIS, ROUTINE W REFLEX MICROSCOPIC
Bilirubin Urine: NEGATIVE
Glucose, UA: NEGATIVE mg/dL
Hgb urine dipstick: NEGATIVE
Ketones, ur: NEGATIVE mg/dL
Nitrite: NEGATIVE
Protein, ur: NEGATIVE mg/dL
Specific Gravity, Urine: 1.015 (ref 1.005–1.030)
pH: 6 (ref 5.0–8.0)

## 2023-12-30 LAB — COMPREHENSIVE METABOLIC PANEL
ALT: 14 U/L (ref 0–44)
AST: 15 U/L (ref 15–41)
Albumin: 3.6 g/dL (ref 3.5–5.0)
Alkaline Phosphatase: 70 U/L (ref 38–126)
Anion gap: 8 (ref 5–15)
BUN: 6 mg/dL (ref 6–20)
CO2: 23 mmol/L (ref 22–32)
Calcium: 9.5 mg/dL (ref 8.9–10.3)
Chloride: 101 mmol/L (ref 98–111)
Creatinine, Ser: 0.76 mg/dL (ref 0.44–1.00)
GFR, Estimated: >60 mL/min (ref >60)
Glucose, Bld: 123 mg/dL — ABNORMAL HIGH (ref 70–99)
Potassium: 3.4 mmol/L — ABNORMAL LOW (ref 3.5–5.1)
Sodium: 132 mmol/L — ABNORMAL LOW (ref 135–145)
Total Bilirubin: 0.5 mg/dL (ref 0.0–1.2)
Total Protein: 7.2 g/dL (ref 6.5–8.1)

## 2023-12-30 LAB — URINALYSIS, MICROSCOPIC (REFLEX): RBC / HPF: NONE SEEN RBC/hpf (ref 0–5)

## 2023-12-30 LAB — PREGNANCY, URINE: Preg Test, Ur: NEGATIVE

## 2023-12-30 LAB — RESP PANEL BY RT-PCR (RSV, FLU A&B, COVID)  RVPGX2
Influenza A by PCR: NEGATIVE
Influenza B by PCR: NEGATIVE
Resp Syncytial Virus by PCR: NEGATIVE
SARS Coronavirus 2 by RT PCR: NEGATIVE

## 2023-12-30 MED ORDER — KETOROLAC TROMETHAMINE 15 MG/ML IJ SOLN
15.0000 mg | Freq: Once | INTRAMUSCULAR | Status: AC
Start: 2023-12-30 — End: 2023-12-30
  Administered 2023-12-30: 15 mg via INTRAVENOUS
  Filled 2023-12-30: qty 1

## 2023-12-30 MED ORDER — LACTATED RINGERS IV BOLUS
1000.0000 mL | Freq: Once | INTRAVENOUS | Status: AC
  Administered 2023-12-30: 1000 mL via INTRAVENOUS

## 2023-12-30 MED ORDER — MORPHINE SULFATE (PF) 4 MG/ML IV SOLN: 4.0000 mg | Freq: Once | INTRAVENOUS | Status: DC

## 2023-12-30 MED ORDER — KETOROLAC TROMETHAMINE 10 MG PO TABS: 10.0000 mg | ORAL_TABLET | Freq: Three times a day (TID) | ORAL | 0 refills | Status: AC | PRN

## 2023-12-30 MED ORDER — FOSFOMYCIN TROMETHAMINE 3 G PO PACK
3.0000 g | PACK | Freq: Once | ORAL | Status: AC
  Administered 2023-12-30: 3 g via ORAL
  Filled 2023-12-30: qty 3

## 2023-12-30 NOTE — ED Provider Notes (Signed)
 [place] [facility] Provider Note  CSN: [id number] Arrival date & time: 12/30/23 1320  Chief Complaint(s) Flank Pain  HPI Veronica Cunningham is a 38 y.o. female with extensive history of nephrolithiasis with altered urologic anatomy who presents Emergency Department for evaluation of flank pain.  Patient was seen on 12/26/2023 and diagnosed with moderate left hydro utero nephrosis with a 6 mm UVJ stone at the site of an ectopic left kidney as well as nonobstructing right nephrolithiasis.  States that she has been taking ibuprofen , Flomax  and oxycodone  at home without improvement of symptoms.  Has been straining urine at home and has not seen expulsion of stone.  Has required lithotripsy in the past.  Denies chest pain, shortness of breath, headache, fever or other systemic symptoms.   Past Medical History Past Medical History:  Diagnosis Date   History of kidney stones    Renal calculi    bilateral   There are no active problems to display for this patient.  Home Medication(s) Prior to Admission medications   Medication Sig Start Date End Date Taking? Authorizing Provider  cetirizine (ZYRTEC) 10 MG tablet Take 10 mg by mouth daily.    [provider]  HYDROcodone -acetaminophen  (NORCO/VICODIN) 5-325 MG tablet Take 1 tablet by mouth every 6 (six) hours as needed for severe pain (pain score 7-10). 12/26/23   Roselyn Carlin NOVAK, MD  ibuprofen  (ADVIL ) 600 MG tablet Take 1 tablet (600 mg total) by mouth every 6 (six) hours as needed for up to 30 doses for mild pain (pain score 1-3) or moderate pain (pain score 4-6). 12/26/23   Roselyn Carlin NOVAK, MD  ondansetron  (ZOFRAN -ODT) 4 MG disintegrating tablet Take 1 tablet (4 mg total) by mouth every 8 (eight) hours as needed for nausea or vomiting. 12/26/23   Roselyn Carlin NOVAK, MD  tamsulosin  (FLOMAX ) 0.4 MG CAPS capsule Take 1 capsule (0.4 mg total) by mouth daily. 12/27/23   Dreama Longs, MD                                                                                                                                     Past Surgical History Past Surgical History:  Procedure Laterality Date   CYSTOSCOPY WITH RETROGRADE PYELOGRAM, URETEROSCOPY AND STENT PLACEMENT Right 09/13/2015   Procedure: CYSTOSCOPY WITH RETROGRADE PYELOGRAM, URETEROSCOPY,STONE EXTRACTION AND STENT PLACEMENT;  Surgeon: Belvie LITTIE Clara, MD;  Location: [facility];  Service: Urology;  Laterality: Right;   CYSTOSCOPY WITH URETEROSCOPY AND STENT PLACEMENT Right 06/07/2015   Procedure: CYSTOSCOPY WITH RETROGRADE PYLEGRAM, URETEROSCOPY, STONE EXTRACTION, AND STENT PLACEMENT;  Surgeon: Belvie LITTIE Clara, MD;  Location: [facility];  Service: Urology;  Laterality: Right;   HOLMIUM LASER APPLICATION Right 06/07/2015   Procedure: HOLMIUM LASER APPLICATION;  Surgeon: Belvie LITTIE Clara, MD;  Location: [facility];  Service: Urology;  Laterality: Right;   Family History History  reviewed. No pertinent family history.  Social History Social History   Tobacco Use   Smoking status: Never   Smokeless tobacco: Never  Substance Use Topics   Alcohol use: Yes    Comment: rare   Drug use: No   Allergies Patient has no known allergies.  Review of Systems Review of Systems  Gastrointestinal:  Positive for abdominal pain.  Genitourinary:  Positive for flank pain.    Physical Exam Vital Signs  I have reviewed the triage vital signs BP (!) 162/118 (BP Location: Right Arm)   Pulse (!) 119   Temp 98.5 F (36.9 C)   Resp 20   Ht 5' 1 (1.549 m)   Wt 120.9 kg   LMP 12/16/2023   SpO2 98%   BMI 50.36 kg/m   Physical Exam Vitals and nursing note reviewed.  Constitutional:      General: She is not in acute distress.    Appearance: She is well-developed.  HENT:     Head: Normocephalic and atraumatic.  Eyes:     Conjunctiva/sclera: Conjunctivae normal.  Cardiovascular:     Rate and  Rhythm: Normal rate and regular rhythm.     Heart sounds: No murmur heard. Pulmonary:     Effort: Pulmonary effort is normal. No respiratory distress.     Breath sounds: Normal breath sounds.  Abdominal:     Palpations: Abdomen is soft.     Tenderness: There is no abdominal tenderness. There is left CVA tenderness.  Musculoskeletal:        General: No swelling.     Cervical back: Neck supple.  Skin:    General: Skin is warm and dry.     Capillary Refill: Capillary refill takes less than 2 seconds.  Neurological:     Mental Status: She is alert.  Psychiatric:        Mood and Affect: Mood normal.     ED Results and Treatments Labs (all labs ordered are listed, but only abnormal results are displayed) Labs Reviewed  RESP PANEL BY RT-PCR (RSV, FLU A&B, COVID)  RVPGX2  URINALYSIS, ROUTINE W REFLEX MICROSCOPIC  PREGNANCY, URINE  CBC WITH DIFFERENTIAL/PLATELET  COMPREHENSIVE METABOLIC PANEL                                                                                                                          Radiology No results found.  Pertinent labs & imaging results that were available during my care of the patient were reviewed by me and considered in my medical decision making (see MDM for details).  Medications Ordered in ED Medications  lactated ringers  bolus 1,000 mL (has no administration in time range)  ketorolac  (TORADOL ) 15 MG/ML injection 15 mg (has no administration in time range)  morphine  (PF) 4 MG/ML injection 4 mg (has no administration in time range)  Procedures Procedures  (including critical care time)  Medical Decision Making / ED Course   This patient presents to the ED for concern of flank pain, this involves an extensive number of treatment options, and is a complaint that carries with it a high risk of complications  and morbidity.  The differential diagnosis includes nephrolithiasis, pyelonephritis, obstruction, AAA, musculoskeletal strain, vertebral fracture, intra-abdominal abscess, diverticulitis  MDM: Patient seen emergency room for evaluation of flank pain.  Physical exam with tenderness in the left CVA but is otherwise unremarkable.  Laboratory evaluation with a hemoglobin of 9.9, MCV 75.6, potassium 3.4, sodium 132.  COVID, flu, RSV negative and obtained in the setting of myalgias and persistent pain.  Urinalysis with small leuk esterase, many bacteria but no white blood cells.  CT stone study with a persistent 7 mm left UVJ stone as well as a left ectopic kidney.  Spoke with the urologist on-call Dr. Elisabeth who is recommending outpatient follow-up for lithotripsy.  Patient successfully pain controlled with Toradol  and we will transition her NSAID therapy to oral Toradol .  We will cover empirically with fosfomycin here in the emergency department given known persistent stone and questionable urine.  At this time with symptoms under control patient does not meet inpatient criteria for admission and will be discharged with urgent outpatient urologic follow-up.  Return precautions given of which she voiced understanding   Additional history obtained:  -External records from outside source obtained and reviewed including: Chart review including previous notes, labs, imaging, consultation notes   Lab Tests: -I ordered, reviewed, and interpreted labs.   The pertinent results include:   Labs Reviewed  RESP PANEL BY RT-PCR (RSV, FLU A&B, COVID)  RVPGX2  URINALYSIS, ROUTINE W REFLEX MICROSCOPIC  PREGNANCY, URINE  CBC WITH DIFFERENTIAL/PLATELET  COMPREHENSIVE METABOLIC PANEL      Imaging Studies ordered: I ordered imaging studies including CT stone study I independently visualized and interpreted imaging. I agree with the radiologist interpretation   Medicines ordered and prescription drug  management: Meds ordered this encounter  Medications   lactated ringers  bolus 1,000 mL   ketorolac  (TORADOL ) 15 MG/ML injection 15 mg   morphine  (PF) 4 MG/ML injection 4 mg    -I have reviewed the patients home medicines and have made adjustments as needed  Critical interventions none  Consultations Obtained: I requested consultation with the urologist on-call Dr. Elisabeth,  and discussed lab and imaging findings as well as pertinent plan - they recommend: Outpatient follow-up for lithotripsy   Cardiac Monitoring: The patient was maintained on a cardiac monitor.  I personally viewed and interpreted the cardiac monitored which showed an underlying rhythm of: NSR  Social Determinants of Health:  Factors impacting patients care include: none   Reevaluation: After the interventions noted above, I reevaluated the patient and found that they have :improved  Co morbidities that complicate the patient evaluation  Past Medical History:  Diagnosis Date   History of kidney stones    Renal calculi    bilateral      Dispostion: I considered admission for this patient, but at this time she does not meet inpatient criteria for admission and will be discharged with outpatient follow-up and return precautions     Final Clinical Impression(s) / ED Diagnoses Final diagnoses:  None     @PCDICTATION @    Albertina Dixon, MD 12/30/23 [phone]

## 2023-12-30 NOTE — ED Triage Notes (Signed)
 Pt reports increased LT flank pain; decreased appetite; pain meds not helping (dx with kidney stones 1/8); +NV

## 2024-01-01 ENCOUNTER — Other Ambulatory Visit: Payer: Self-pay | Admitting: Urology

## 2024-01-01 ENCOUNTER — Ambulatory Visit (INDEPENDENT_AMBULATORY_CARE_PROVIDER_SITE_OTHER): Payer: BC Managed Care – PPO | Admitting: Urology

## 2024-01-01 ENCOUNTER — Ambulatory Visit (HOSPITAL_BASED_OUTPATIENT_CLINIC_OR_DEPARTMENT_OTHER)
Admission: RE | Admit: 2024-01-01 | Discharge: 2024-01-01 | Disposition: A | Discharge status: Home / Self Care | Payer: BC Managed Care – PPO | Source: Ambulatory Visit | Attending: Urology | Admitting: Urology

## 2024-01-01 ENCOUNTER — Encounter: Payer: Self-pay | Admitting: Urology

## 2024-01-01 VITALS — BP 168/110 | HR 163

## 2024-01-01 DIAGNOSIS — N201 Calculus of ureter: Secondary | ICD-10-CM

## 2024-01-01 DIAGNOSIS — R8290 Unspecified abnormal findings in urine: Secondary | ICD-10-CM | POA: Diagnosis not present

## 2024-01-01 DIAGNOSIS — Q632 Ectopic kidney: Secondary | ICD-10-CM | POA: Diagnosis not present

## 2024-01-01 DIAGNOSIS — N200 Calculus of kidney: Secondary | ICD-10-CM | POA: Insufficient documentation

## 2024-01-01 MED ORDER — OXYCODONE HCL 5 MG PO TABS: 5.0000 mg | ORAL_TABLET | Freq: Four times a day (QID) | ORAL | 0 refills | Status: AC | PRN

## 2024-01-01 MED ORDER — SULFAMETHOXAZOLE-TRIMETHOPRIM 800-160 MG PO TABS
2.0000 | ORAL_TABLET | Freq: Once | ORAL | Status: AC
  Administered 2024-01-01: 2 via ORAL

## 2024-01-01 NOTE — Progress Notes (Signed)
 Assessment: 1. Ureteral calculus, left   2. Nephrolithiasis   3. Ectopic kidney   4. Abnormal urine findings     Plan: I personally reviewed the patient's chart including provider notes, lab and imaging results. I personally reviewed the CT studies from 12/26/2023 and 12/30/2023 with results as noted below. Diagnosis and treatment options for a ureteral calculus including spontaneous stone passage and ureteroscopic stone manipulation were discussed with Veronica Cunningham in detail.   Following our discussion, she would like to proceed with ureteroscopic stone manipulation. I discussed her case with Dr. Carolee who is on-call for the urology service today.  Risks, benefits, alternatives were discussed with the patient in detail.  Potential risks including, but not limited to, infection; bleeding;  injury to urethra, bladder, or ureter; possible need of other treatments; possible failure to remove the calculus; ureteral  stricture formation; cardiac, pulmonary, cerebrovascular events; and anesthetic complications were discussed.  The patient understands and wishes to proceed.  She will be scheduled to undergo cystoscopy, retrograde pyelogram, left ureteroscopy with laser lithotripsy and stent insertion by Dr. Carolee tomorrow morning.  Urine culture sent today. Begin Bactrim  DS twice daily.  Dose given in office and patient instructed to take dose tonight. Prescription for oxycodone  5 mg every 6 hours sent to pharmacy. Continue tamsulosin . Continue to strain urine.  Chief Complaint:  Chief Complaint  Patient presents with   Nephrolithiasis    History of Present Illness:  Veronica Cunningham is a 38 y.o. female who is seen for evaluation of a left ureteral calculus. She has a history of nephrolithiasis and has undergone cystoscopy with ureteroscopy and laser lithotripsy in the past.  She has a ectopic left kidney.   She presented to the emergency room on 12/26/2023 with right sided  abdominal pain.  CT urogram showed a 6 mm calculus near the left UVJ with moderate left hydroureteronephrosis and a nonobstructing right stone.  She was treated with pain medication and discharged.  She returned to the emergency room on 12/30/2023 with pain.  Repeat CT imaging again showed a 7 mm calculus near the left UVJ with persistent left-sided hydronephrosis. She has continued to have significant right sided abdominal pain and left sided pelvic pain.  She has had nausea and vomiting daily.  Her pain has persisted despite taking hydrocodone  and Toradol .  She is not having any fevers or chills.  She does report frequency, urgency, and nocturia.  Gross hematuria.  Past Medical History:  Past Medical History:  Diagnosis Date   History of kidney stones    Renal calculi    bilateral    Past Surgical History:  Past Surgical History:  Procedure Laterality Date   CYSTOSCOPY WITH RETROGRADE PYELOGRAM, URETEROSCOPY AND STENT PLACEMENT Right 09/13/2015   Procedure: CYSTOSCOPY WITH RETROGRADE PYELOGRAM, URETEROSCOPY,STONE EXTRACTION AND STENT PLACEMENT;  Surgeon: Belvie LITTIE Clara, MD;  Location: [facility];  Service: Urology;  Laterality: Right;   CYSTOSCOPY WITH URETEROSCOPY AND STENT PLACEMENT Right 06/07/2015   Procedure: CYSTOSCOPY WITH RETROGRADE PYLEGRAM, URETEROSCOPY, STONE EXTRACTION, AND STENT PLACEMENT;  Surgeon: Belvie LITTIE Clara, MD;  Location: [facility];  Service: Urology;  Laterality: Right;   HOLMIUM LASER APPLICATION Right 06/07/2015   Procedure: HOLMIUM LASER APPLICATION;  Surgeon: Belvie LITTIE Clara, MD;  Location: [facility];  Service: Urology;  Laterality: Right;    Allergies:  No Known Allergies  Family History:  History reviewed. No pertinent family history.  Social History:  Social History  Tobacco Use   Smoking status: Never   Smokeless tobacco: Never  Substance Use Topics   Alcohol use: Yes    Comment: rare   Drug  use: No    Review of symptoms:  Constitutional:  Negative for unexplained weight loss, night sweats, fever, chills ENT:  Negative for nose bleeds, sinus pain, painful swallowing CV:  Negative for chest pain, shortness of breath, exercise intolerance, palpitations, loss of consciousness Resp:  Negative for cough, wheezing, shortness of breath GI:  Negative for nausea, vomiting, diarrhea, bloody stools GU:  Positives noted in HPI; otherwise negative for gross hematuria, dysuria, urinary incontinence Neuro:  Negative for seizures, poor balance, limb weakness, slurred speech Psych:  Negative for lack of energy, depression, anxiety Endocrine:  Negative for polydipsia, polyuria, symptoms of hypoglycemia (dizziness, hunger, sweating) Hematologic:  Negative for anemia, purpura, petechia, prolonged or excessive bleeding, use of anticoagulants  Allergic:  Negative for difficulty breathing or choking as a result of exposure to anything; no shellfish allergy; no allergic response (rash/itch) to materials, foods  Physical exam: BP (!) 168/110   Pulse (!) 163   LMP 12/16/2023  GENERAL APPEARANCE:  Well appearing, well developed, well nourished, NAD HEENT: Atraumatic, Normocephalic, oropharynx clear. NECK: Supple without lymphadenopathy or thyromegaly. LUNGS: Clear to auscultation bilaterally. HEART: Regular Rate and Rhythm without murmurs, gallops, or rubs. ABDOMEN: Soft, non-tender, No Masses. EXTREMITIES: Moves all extremities well.  Without clubbing, cyanosis, or edema. NEUROLOGIC:  Alert and oriented x 3, normal gait, CN II-XII grossly intact.  MENTAL STATUS:  Appropriate. BACK:  Non-tender to palpation.  No CVAT SKIN:  Warm, dry and intact.    Results: U/A:  11-30 WBC, 3-10 RBC, many bacteria, nitrite negative

## 2024-01-03 ENCOUNTER — Other Ambulatory Visit: Payer: Self-pay | Admitting: Urology

## 2024-01-03 LAB — MICROSCOPIC EXAMINATION

## 2024-01-03 LAB — URINALYSIS, ROUTINE W REFLEX MICROSCOPIC
Bilirubin, UA: NEGATIVE
Glucose, UA: NEGATIVE
Ketones, UA: NEGATIVE
Nitrite, UA: NEGATIVE
Protein,UA: NEGATIVE
Specific Gravity, UA: 1.02 (ref 1.005–1.030)
Urobilinogen, Ur: 1 mg/dL (ref 0.2–1.0)
pH, UA: 6.5 (ref 5.0–7.5)

## 2024-01-03 LAB — URINE CULTURE: Organism ID, Bacteria: NO GROWTH

## 2024-01-03 MED ORDER — ONDANSETRON 4 MG PO TBDP: 4.0000 mg | ORAL_TABLET | Freq: Three times a day (TID) | ORAL | 0 refills | Status: AC | PRN

## 2024-01-03 MED ORDER — PHENAZOPYRIDINE HCL 200 MG PO TABS: 200.0000 mg | ORAL_TABLET | Freq: Three times a day (TID) | ORAL | 0 refills | Status: AC | PRN
